# Patient Record
Sex: Female | Born: 1970 | Race: White | Hispanic: No | Marital: Married
Health system: Southern US, Community
[De-identification: ages and names within clinical notes are randomized; demographics above are authoritative.]

## PROBLEM LIST (undated history)

## (undated) DIAGNOSIS — T7840XA Allergy, unspecified, initial encounter: Secondary | ICD-10-CM

## (undated) DIAGNOSIS — D649 Anemia, unspecified: Secondary | ICD-10-CM

## (undated) DIAGNOSIS — K219 Gastro-esophageal reflux disease without esophagitis: Secondary | ICD-10-CM

## (undated) DIAGNOSIS — M1990 Unspecified osteoarthritis, unspecified site: Secondary | ICD-10-CM

## (undated) HISTORY — DX: Unspecified osteoarthritis, unspecified site: M19.90

## (undated) HISTORY — DX: Gastro-esophageal reflux disease without esophagitis: K21.9

## (undated) HISTORY — PX: COSMETIC SURGERY: SHX468

## (undated) HISTORY — DX: Allergy, unspecified, initial encounter: T78.40XA

## (undated) HISTORY — DX: Anemia, unspecified: D64.9

---

## 2009-04-14 DIAGNOSIS — E039 Hypothyroidism, unspecified: Secondary | ICD-10-CM | POA: Insufficient documentation

## 2011-08-01 DIAGNOSIS — Z8742 Personal history of other diseases of the female genital tract: Secondary | ICD-10-CM | POA: Insufficient documentation

## 2012-11-25 DIAGNOSIS — N951 Menopausal and female climacteric states: Secondary | ICD-10-CM | POA: Insufficient documentation

## 2013-09-22 DIAGNOSIS — Z803 Family history of malignant neoplasm of breast: Secondary | ICD-10-CM | POA: Insufficient documentation

## 2017-10-24 DIAGNOSIS — M1612 Unilateral primary osteoarthritis, left hip: Secondary | ICD-10-CM | POA: Insufficient documentation

## 2020-01-20 DIAGNOSIS — K219 Gastro-esophageal reflux disease without esophagitis: Secondary | ICD-10-CM | POA: Insufficient documentation

## 2020-01-20 DIAGNOSIS — E559 Vitamin D deficiency, unspecified: Secondary | ICD-10-CM | POA: Insufficient documentation

## 2020-01-20 DIAGNOSIS — E538 Deficiency of other specified B group vitamins: Secondary | ICD-10-CM | POA: Insufficient documentation

## 2021-01-31 ENCOUNTER — Ambulatory Visit (INDEPENDENT_AMBULATORY_CARE_PROVIDER_SITE_OTHER): Payer: BC Managed Care – PPO

## 2021-01-31 ENCOUNTER — Other Ambulatory Visit: Payer: Self-pay

## 2021-01-31 ENCOUNTER — Ambulatory Visit (INDEPENDENT_AMBULATORY_CARE_PROVIDER_SITE_OTHER): Payer: BC Managed Care – PPO | Admitting: Sports Medicine

## 2021-01-31 DIAGNOSIS — M47816 Spondylosis without myelopathy or radiculopathy, lumbar region: Secondary | ICD-10-CM | POA: Diagnosis not present

## 2021-01-31 DIAGNOSIS — M24159 Other articular cartilage disorders, unspecified hip: Secondary | ICD-10-CM | POA: Diagnosis not present

## 2021-01-31 DIAGNOSIS — M5030 Other cervical disc degeneration, unspecified cervical region: Secondary | ICD-10-CM

## 2021-01-31 DIAGNOSIS — M4807 Spinal stenosis, lumbosacral region: Secondary | ICD-10-CM

## 2021-01-31 IMAGING — DX DG LUMBAR SPINE COMPLETE 4+V
5 series · 5 of 5 positions shown · non-contrast
Comparison: None.

CLINICAL DATA: Chronic low back pain.  No recent injury.

EXAM:
LUMBAR SPINE - COMPLETE 4+ VIEW

[l-spine ap]
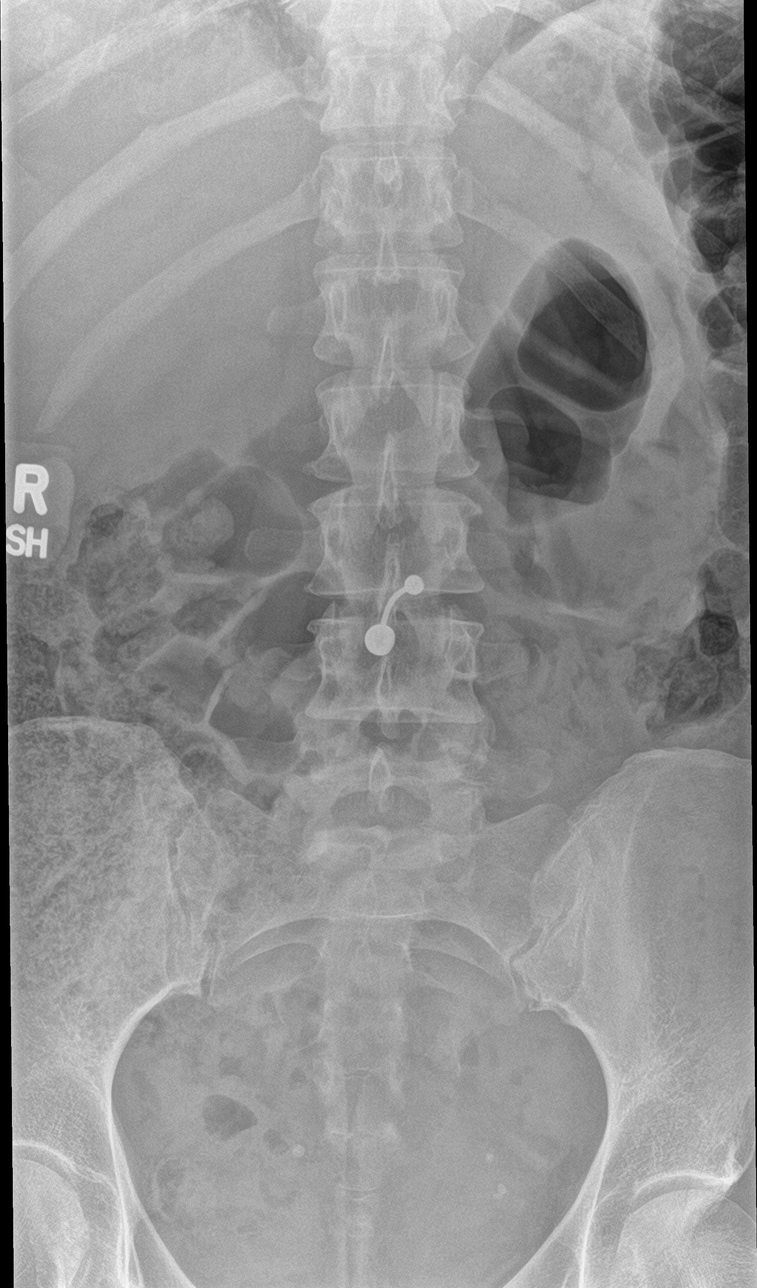

[l-spine obl (1 of 2)]
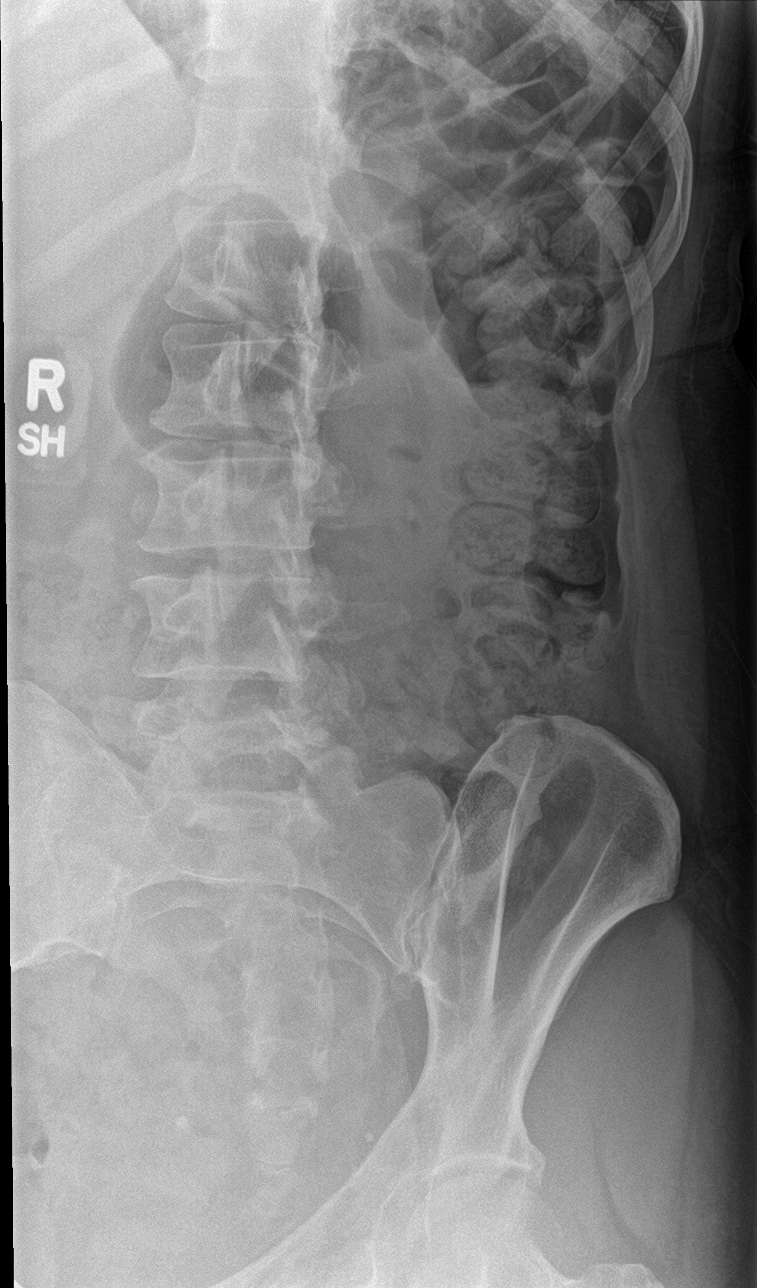

[l-spine obl (2 of 2)]
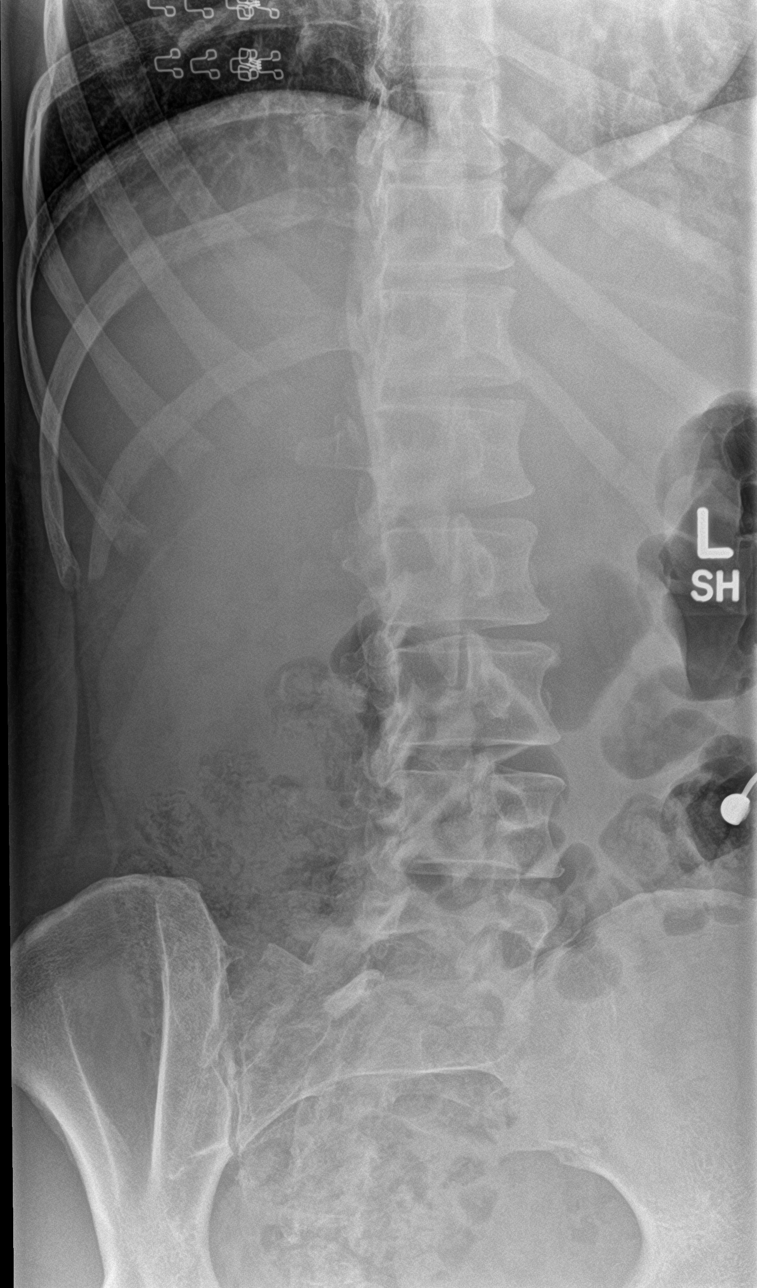

[l-spine lat]
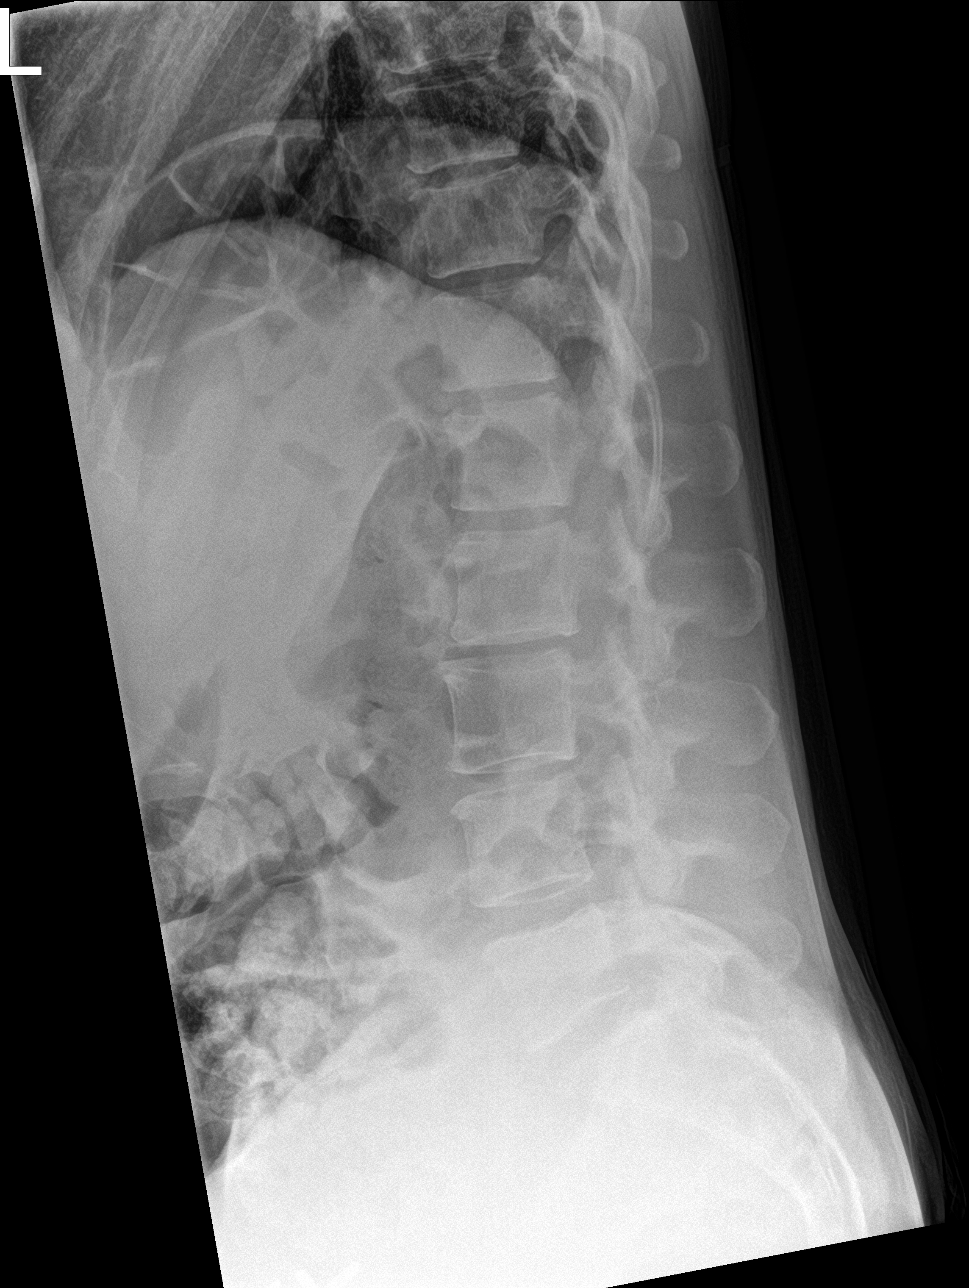

[l-spine spot]
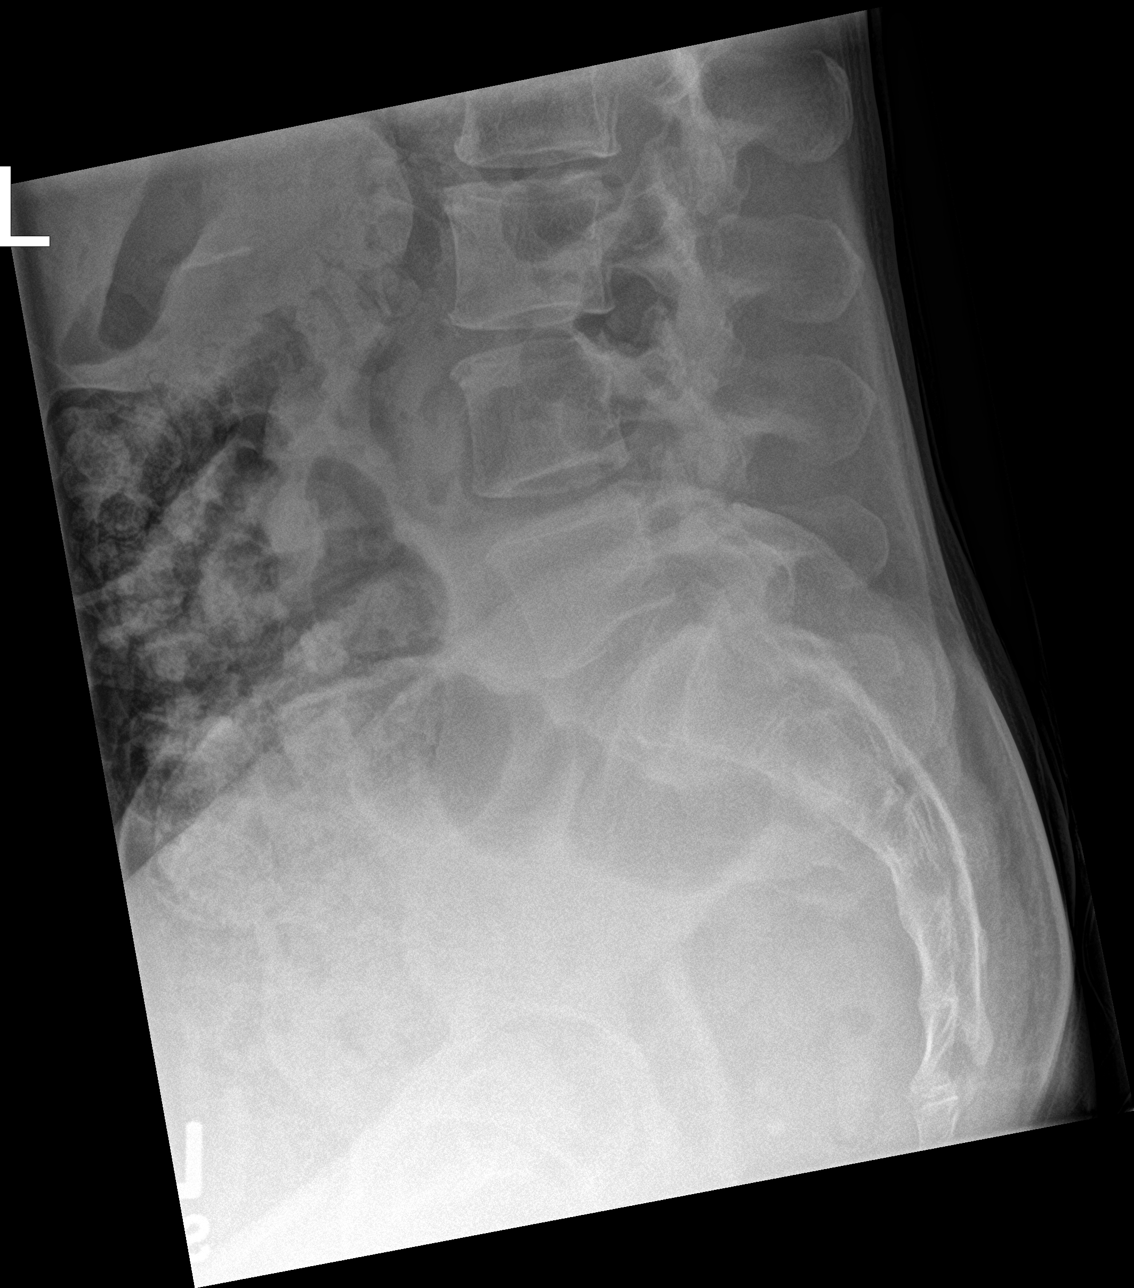

[5 of 5 positions shown; findings below may reference images not displayed]

FINDINGS: Five lumbar type vertebral bodies. Mild straightening of the normal
lumbar lordosis. 2-3 mm of retrolisthesis at L2-3. Mild disc space
narrowing at L2-3. Facet osteoarthritis in the lumbar region, most
pronounced at the L2-3 level. Sacroiliac joints appear normal.
IMPRESSION: Degenerative disc disease at L2-3 with disc space narrowing. Lumbar
region facet osteoarthritis, most pronounced at L2-3, with 2-3 mm
retrolisthesis.

## 2021-01-31 IMAGING — DX DG CERVICAL SPINE COMPLETE 4+V
5 series · 5 of 5 positions shown · non-contrast
Comparison: None.

CLINICAL DATA: Chronic neck pain

EXAM:
CERVICAL SPINE - COMPLETE 4+ VIEW

[c-spine lat]
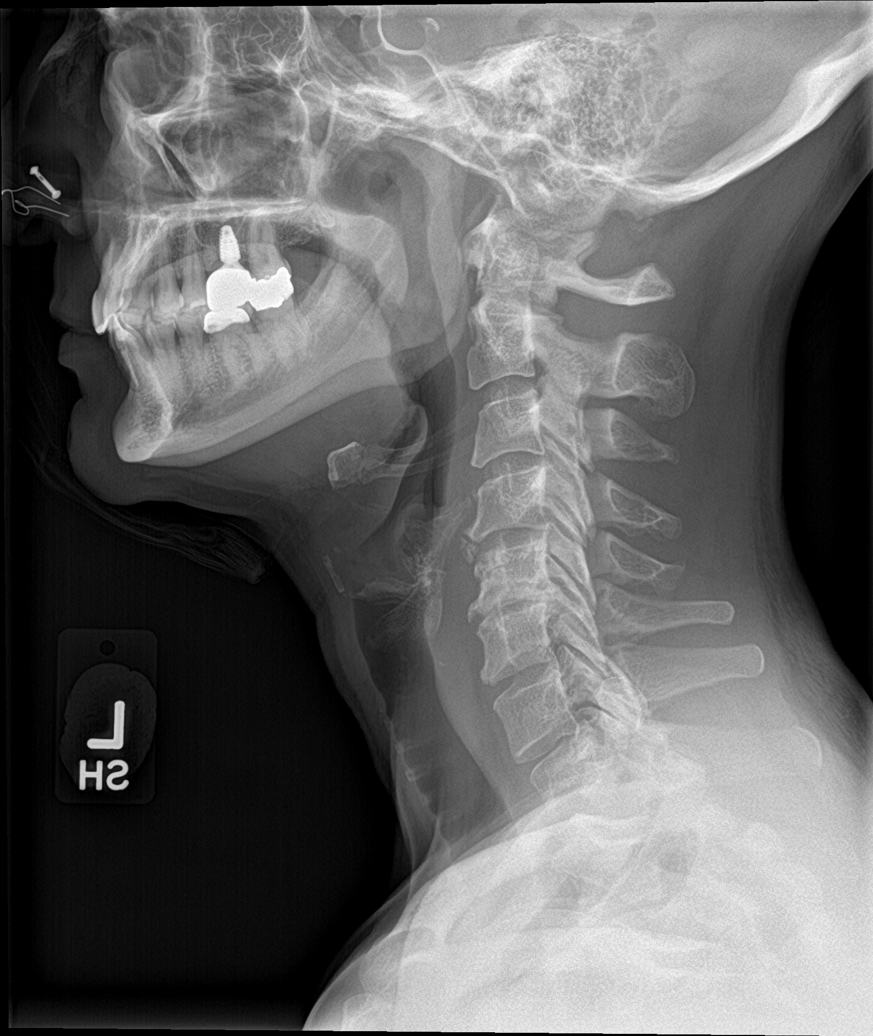

[c-spine obl (1 of 2)]
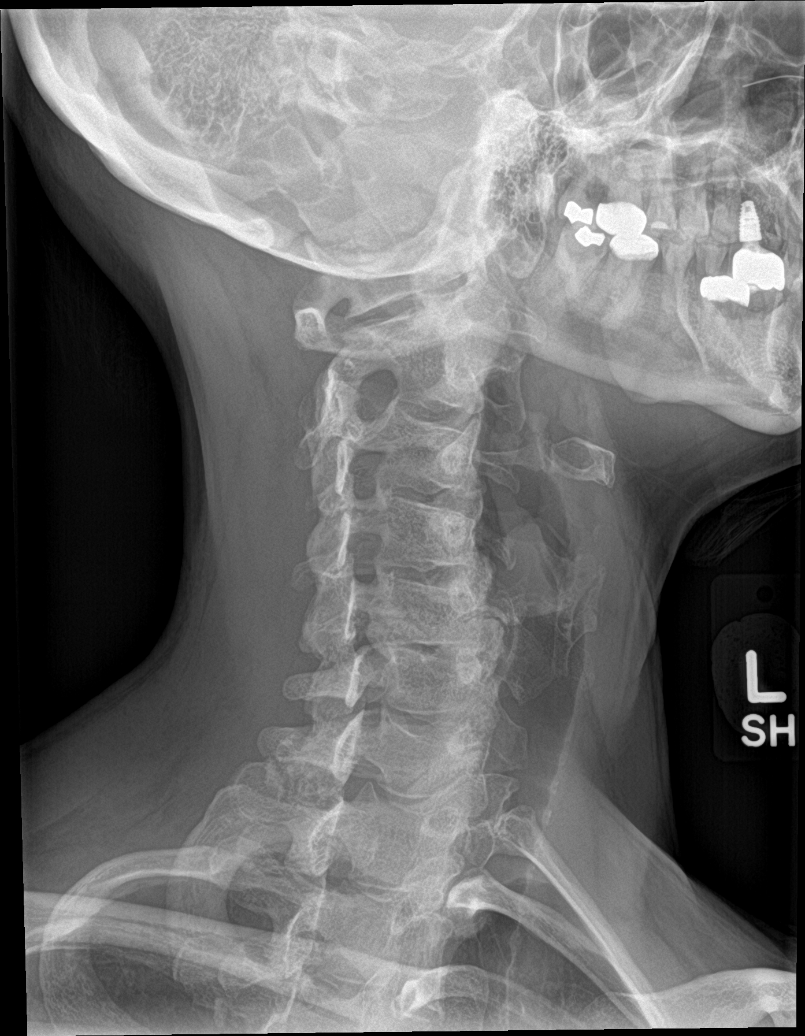

[c-spine obl (2 of 2)]
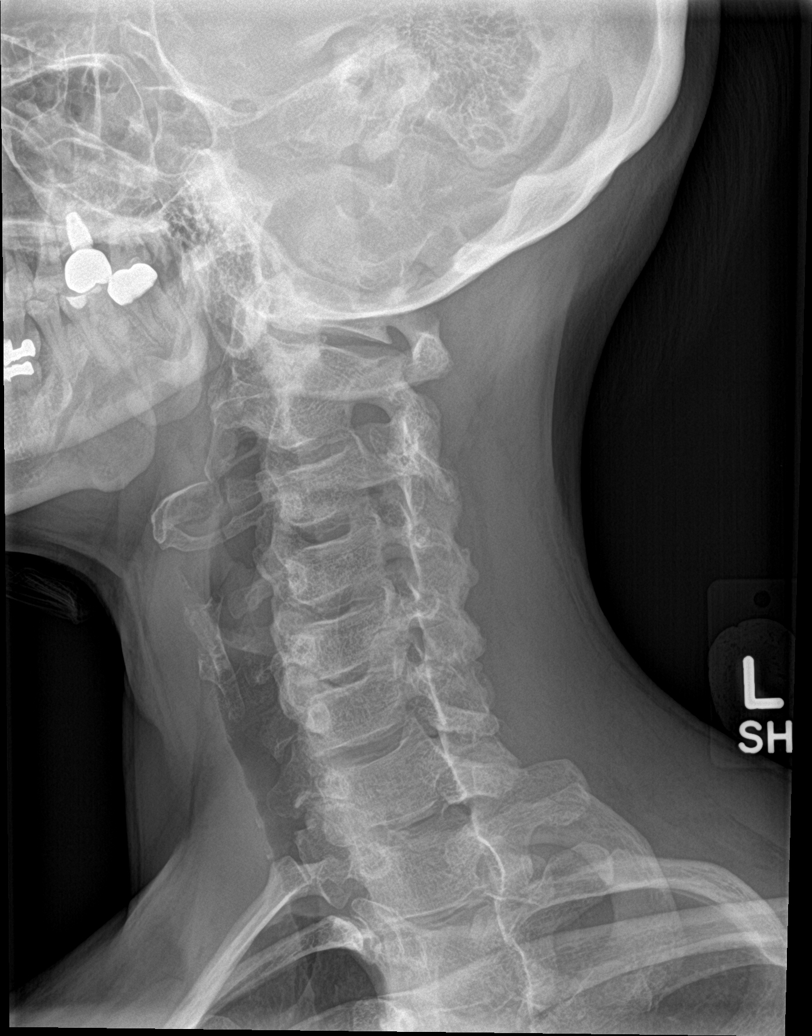

[c-spine ap]
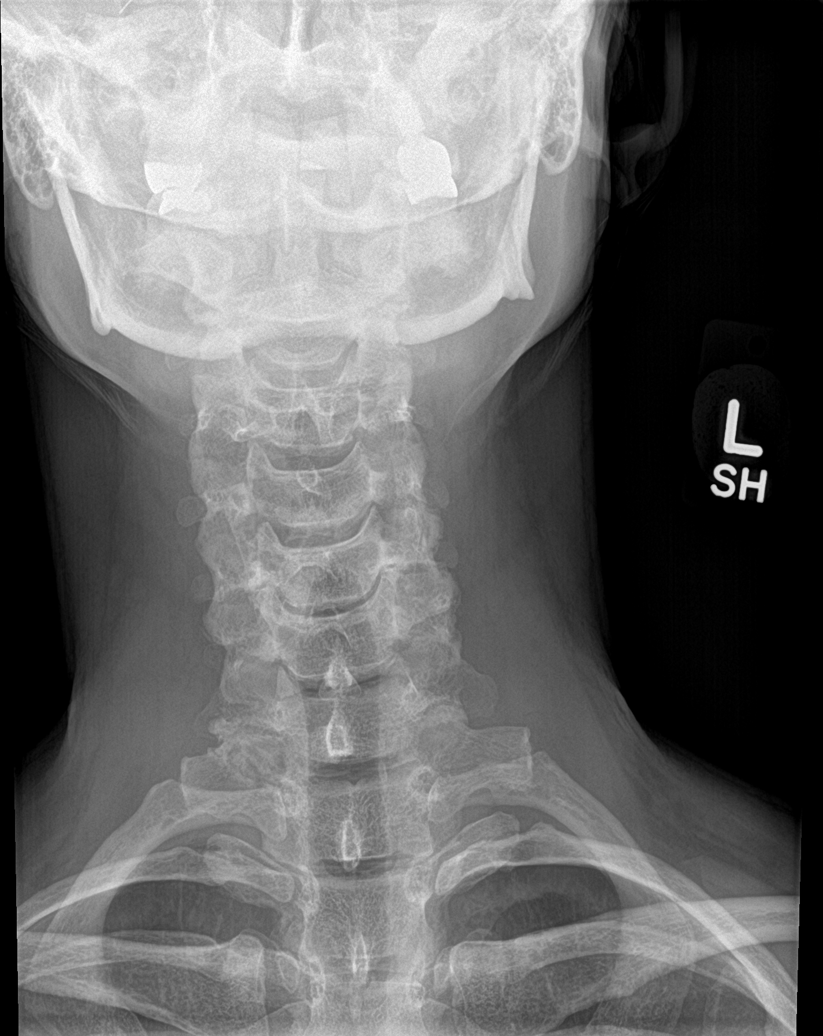

[c-spine open mouth]
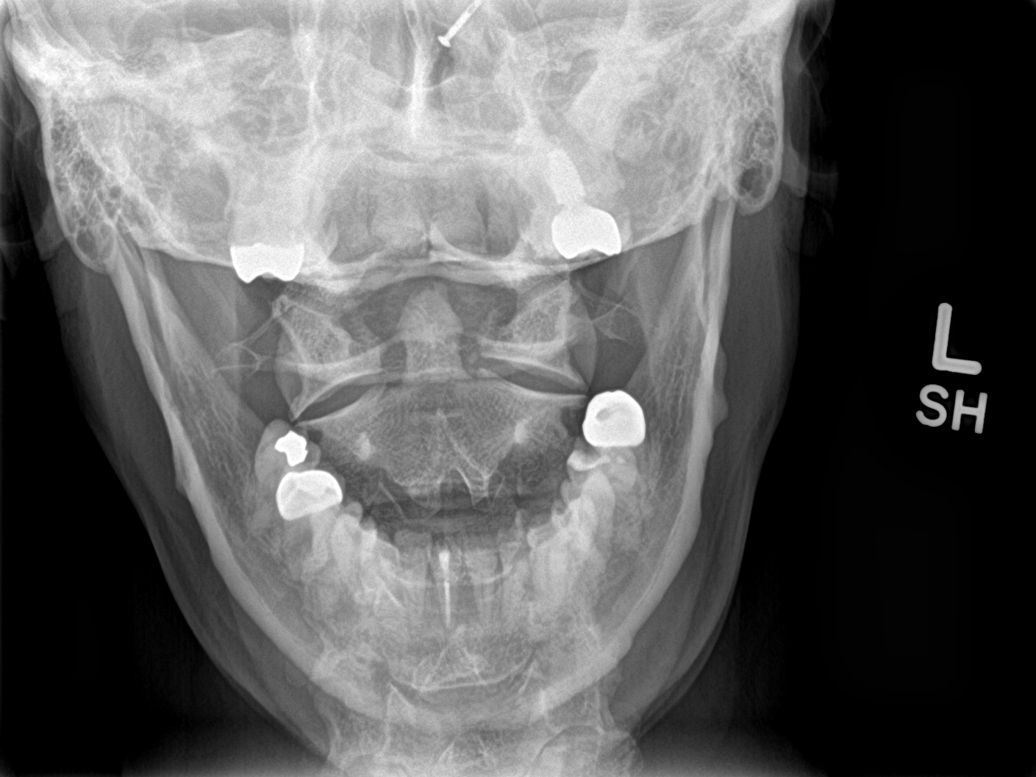

[5 of 5 positions shown; findings below may reference images not displayed]

FINDINGS: Minimal scoliotic curvature convex to the right in the upper
cervical region and to the left in the lower cervical region. Mild
straightening of the normal cervical lordosis. Chronic degenerative
spondylosis most pronounced at C5-6 with disc space narrowing and
marginal osteophytes. Lesser disc space narrowing at C4-5. Facet
osteoarthritis on the right at C7-T1. Bony foraminal narrowing on
the right at C5-6 and to a lesser degree on the left at C5-6. No
focal finding otherwise.
IMPRESSION: Mild scoliotic curvature and straightening of the normal cervical
lordosis.

Degenerative spondylosis, most pronounced at the C5-6 level with
disc space narrowing and marginal osteophytes. Some bony foraminal
narrowing, right more than left.

Facet osteoarthritis on the right at C7-T1.

## 2021-01-31 NOTE — Assessment & Plan Note (Addendum)
This is a pleasant 50 year old female with a long history of neck and low back pain. She has chronic right-sided low back pain worse with standing, nothing overtly radicular. It sounds like she has been through physical therapy, oral medications, ultimately she had some spinal injections, likely epidurals that seem to work about 8 years ago. She is now having recurrence of symptoms, she is not interested in additional physical therapy and short of home rehab, not interested in oral medications but she would like to proceed with interventional treatment. We are going to try to get an MRI approved, x-rays, lumbar spine MRI and we will probably set her up with an epidural based on the results.

## 2021-01-31 NOTE — Assessment & Plan Note (Addendum)
As below she has done really well with epidurals in the past, she does not desire to restart PT with the exception of home rehab, getting some x-rays, MRI, in anticipation of cervical epidural.  Adding cervical epidural. Follow-up me 1 month after the injection we can further discuss her lumbar spine intervention.

## 2021-01-31 NOTE — Assessment & Plan Note (Signed)
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 We can consider injections in the future.

## 2021-01-31 NOTE — Progress Notes (Addendum)
    Procedures performed today:    None.  Independent interpretation of notes and tests performed by another provider:   MRIs reviewed, significant multilevel cervical and lumbar DDD.  Brief History, Exam, Impression, and Recommendations:    Lumbar spondylosis This is a pleasant 50 year old female with a long history of neck and low back pain. She has chronic right-sided low back pain worse with standing, nothing overtly radicular. It sounds like she has been through physical therapy, oral medications, ultimately she had some spinal injections, likely epidurals that seem to work about 8 years ago. She is now having recurrence of symptoms, she is not interested in additional physical therapy and short of home rehab, not interested in oral medications but she would like to proceed with interventional treatment. We are going to try to get an MRI approved, x-rays, lumbar spine MRI and we will probably set her up with an epidural based on the results.  DDD (degenerative disc disease), cervical As below she has done really well with epidurals in the past, she does not desire to restart PT with the exception of home rehab, getting some x-rays, MRI, in anticipation of cervical epidural.  Adding cervical epidural. Follow-up me 1 month after the injection we can further discuss her lumbar spine intervention.  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 We can consider injections in the future.    ___________________________________________ Ihor Austin. Benjamin Stain, M.D., ABFM., CAQSM. Primary Care and Sports Medicine [facility]  Adjunct Instructor of Family Medicine  [employer]

## 2021-01-31 NOTE — Patient Instructions (Signed)
We were unable to find any injection notes through care everywhere, please contact your injection site and have them fax [place] records.  f- [phone]

## 2021-02-03 ENCOUNTER — Ambulatory Visit (INDEPENDENT_AMBULATORY_CARE_PROVIDER_SITE_OTHER): Payer: BC Managed Care – PPO

## 2021-02-03 ENCOUNTER — Other Ambulatory Visit: Payer: Self-pay

## 2021-02-03 DIAGNOSIS — M5030 Other cervical disc degeneration, unspecified cervical region: Secondary | ICD-10-CM

## 2021-02-03 DIAGNOSIS — M47816 Spondylosis without myelopathy or radiculopathy, lumbar region: Secondary | ICD-10-CM

## 2021-02-03 DIAGNOSIS — M4807 Spinal stenosis, lumbosacral region: Secondary | ICD-10-CM

## 2021-02-03 IMAGING — MR MR LUMBAR SPINE W/O CM
4 of 5 series · 26 of 48 positions shown · non-contrast
Comparison: Lumbar spine radiographs [DATE]

CLINICAL DATA: Spinal stenosis. Lumbosacral degenerative disc
disease.

EXAM:
MRI LUMBAR SPINE WITHOUT CONTRAST
TECHNIQUE: Multiplanar, multisequence MR imaging of the lumbar spine was
performed. No intravenous contrast was administered.

[Series 2: T2 · sagittal · 4.0mm · 0.81mm/px · 6 of 15 slices shown (1 of 2)]
[im 1/15]
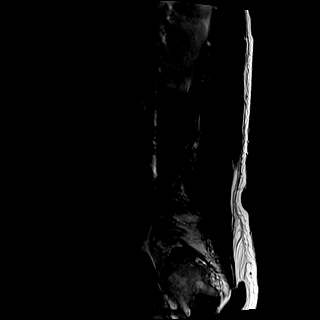
[im 3/15]
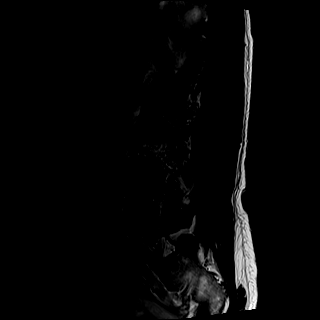
[im 6/15]
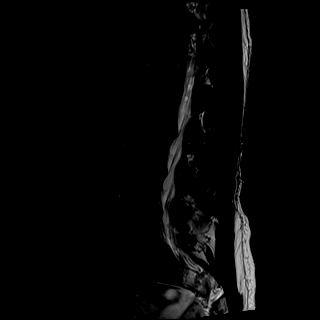
[im 9/15]
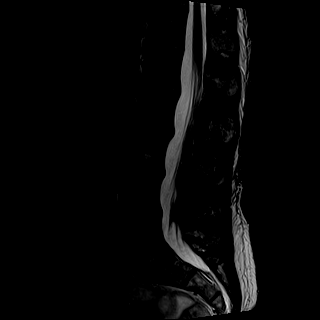
[im 12/15]
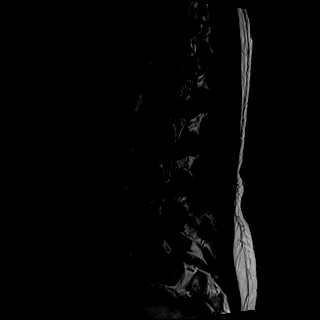
[im 15/15]
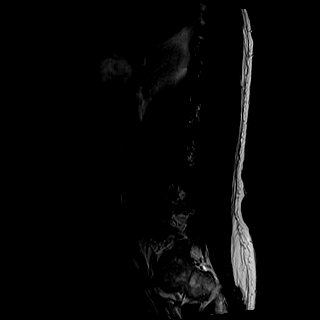

[Series 3: T1 · sagittal · 4.0mm · 0.41mm/px · 6 of 15 slices shown (1 of 2)]
[im 1/15]
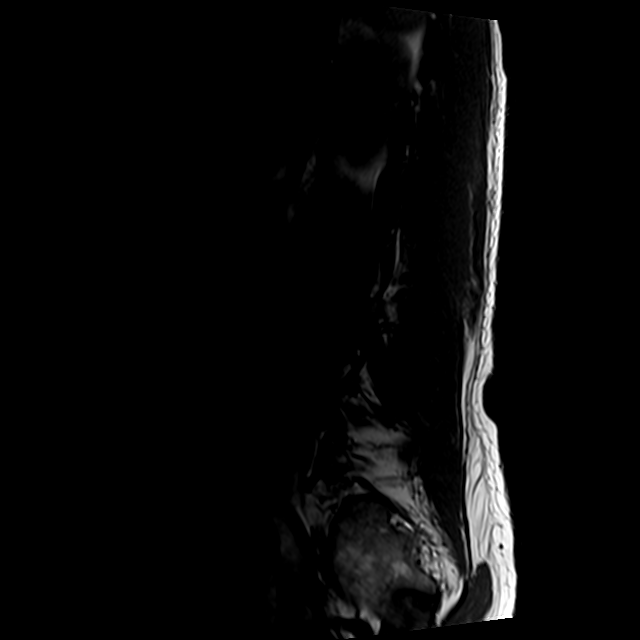
[im 3/15]
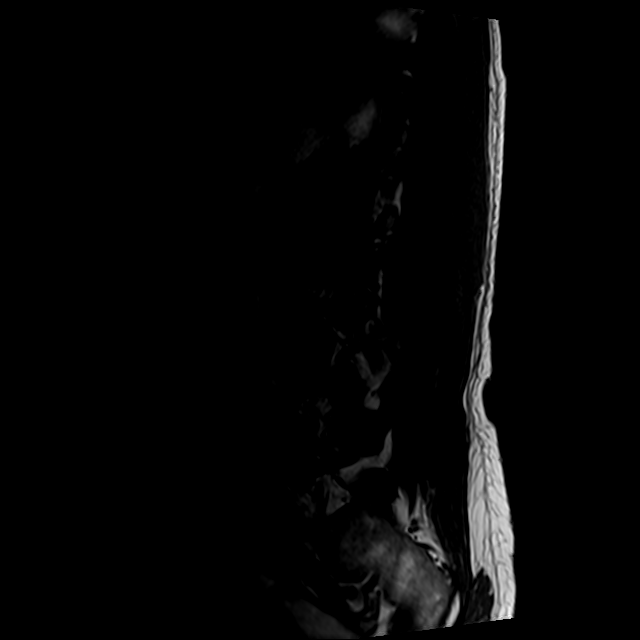
[im 6/15]
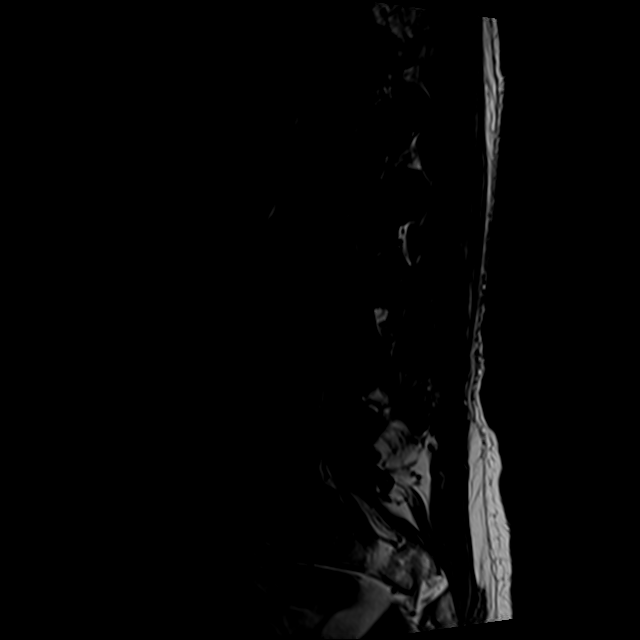
[im 9/15]
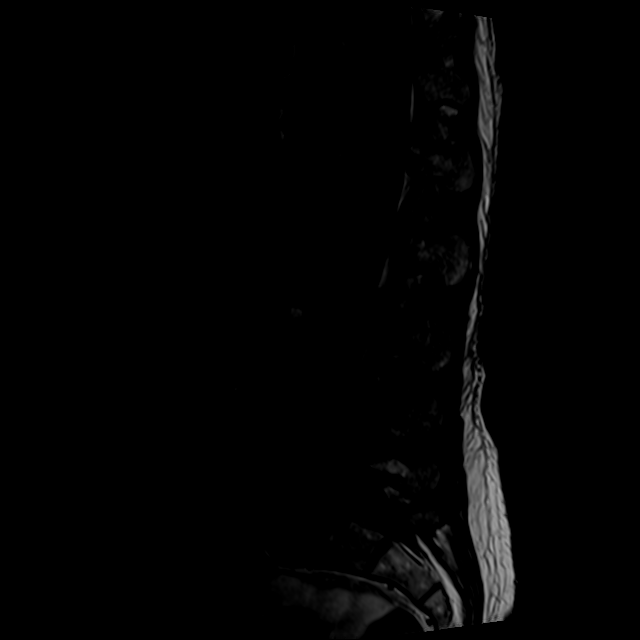
[im 12/15]
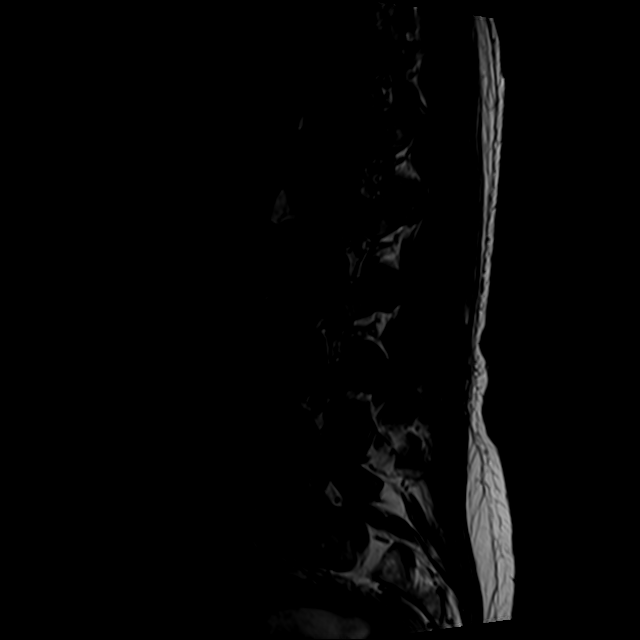
[im 15/15]
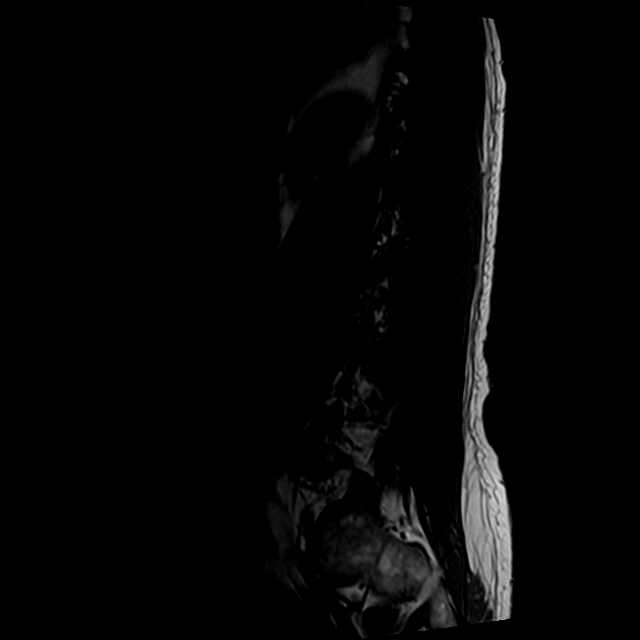

[Series 5: T2 · axial · 4.0mm · 0.78mm/px · z∈[-501,-302]mm · 9 of 36 slices shown (2 of 2)]
[im 1/36]
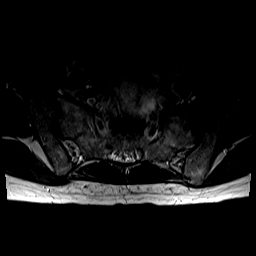
[im 6/36]
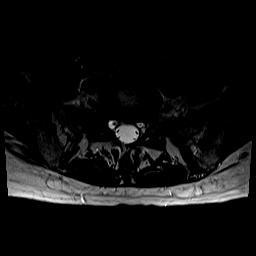
[im 11/36]
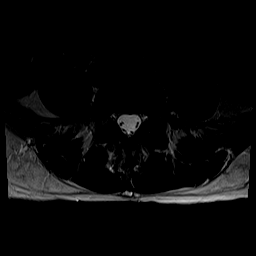
[im 16/36]
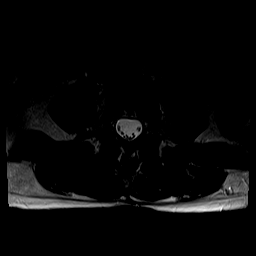
[im 18/36]
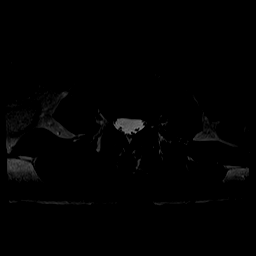
[im 21/36]
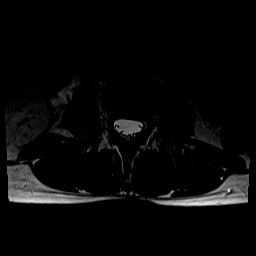
[im 26/36]
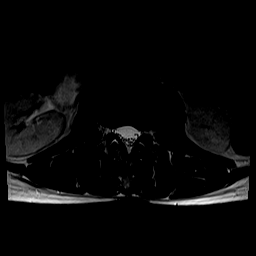
[im 31/36]
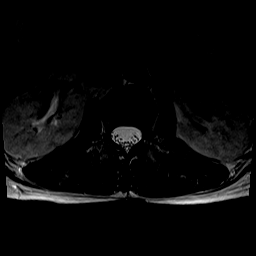
[im 36/36]
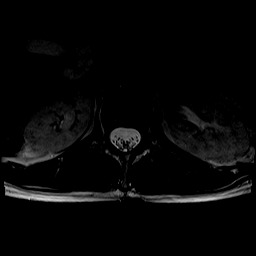

[Series 6: T1 · axial · 4.0mm · 0.39mm/px · z∈[-501,-327]mm · 5 of 36 slices shown (2 of 2)]
[im 1/36]
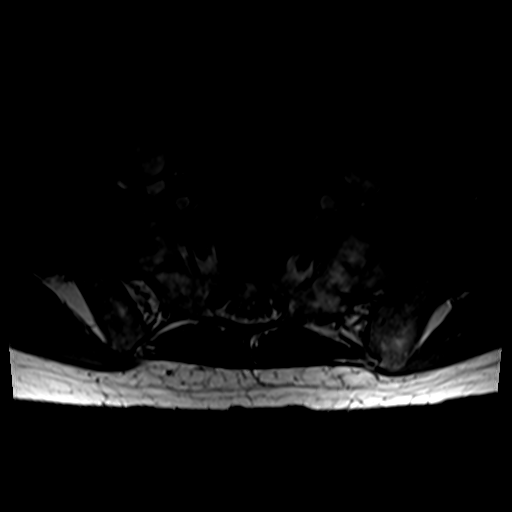
[im 6/36]
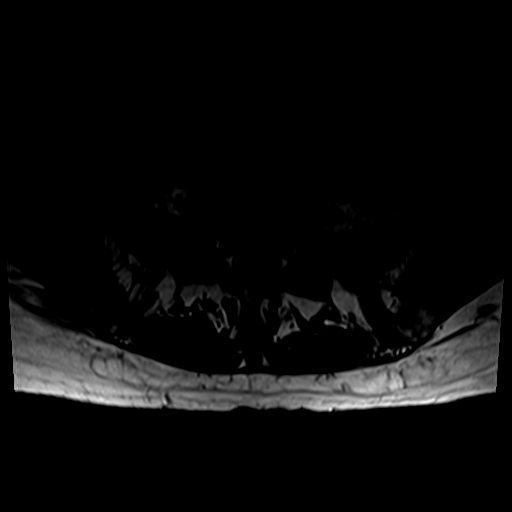
[im 11/36]
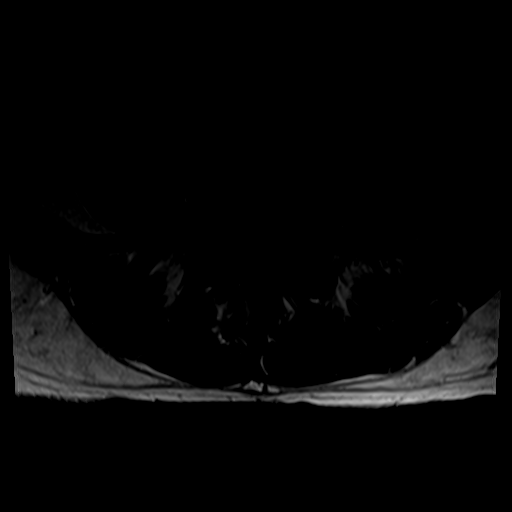
[im 18/36]
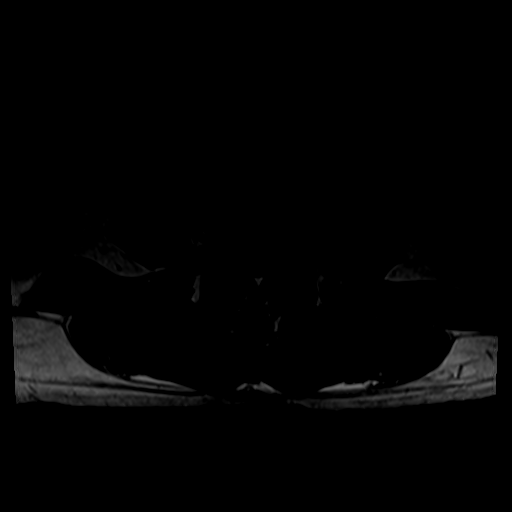
[im 31/36]
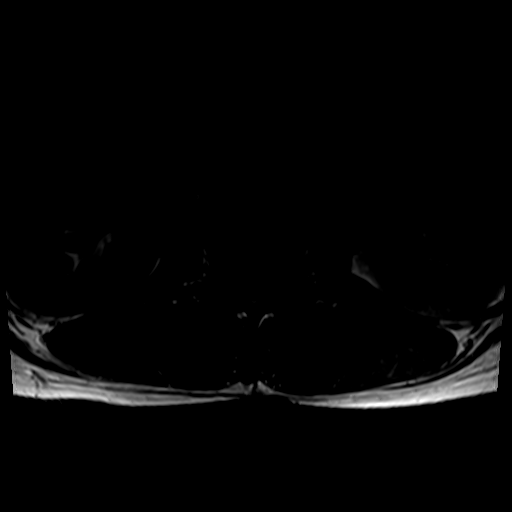

[26 of 48 positions shown; findings below may reference images not displayed]

FINDINGS: Segmentation: 5 non rib-bearing lumbar type vertebral bodies are
present. The lowest fully formed vertebral body is L5.

Alignment: Slight retrolisthesis is again noted at L2-3. No other
significant listhesis is present. There is some straightening of the
normal lumbar lordosis.

Vertebrae:  Marrow signal and vertebral body heights are normal.

Conus medullaris and cauda equina: Conus extends to the L1 level.
Conus and cauda equina appear normal.

Paraspinal and other soft tissues: Limited imaging the abdomen is
unremarkable. There is no significant adenopathy. No solid organ
lesions are present.

Disc levels:

L1-2: Negative.

L2-3: Far left lateral disc protrusion extends into and beyond the
left foramen likely impacting the left L2 nerve root. Central canal
and right foramen are patent.

L3-4: Broad-based disc protrusion is asymmetric to the left. Far
left annular tear pain is noted. Mild left subarticular and
foraminal stenosis is present.

L4-5: Mild disc bulging and bilateral facet hypertrophy is present.
No significant stenosis is present.

L5-S1: Mild facet hypertrophy is present. Disc signal is preserved.
No protrusion is present. No significant stenosis is present.
IMPRESSION: 1. Far left lateral disc protrusion at L2-3 likely impacting the
left L2 nerve root.
2. Broad-based disc protrusion and far left annular tear at L3-4
with mild left subarticular and foraminal stenosis.
3. Mild disc bulging and facet hypertrophy at L4-5 without
significant stenosis.

## 2021-02-06 NOTE — Addendum Note (Signed)
Addended by: Monica Becton on: 02/06/2021 12:47 PM   Modules accepted: Orders

## 2021-02-16 ENCOUNTER — Ambulatory Visit
Admission: RE | Admit: 2021-02-16 | Discharge: 2021-02-16 | Disposition: A | Discharge status: Home / Self Care | Payer: BC Managed Care – PPO | Source: Ambulatory Visit | Attending: Sports Medicine | Admitting: Sports Medicine

## 2021-02-16 DIAGNOSIS — M5030 Other cervical disc degeneration, unspecified cervical region: Secondary | ICD-10-CM

## 2021-02-16 IMAGING — XA DG INJECT/[PERSON_NAME] INC NEEDLE/CATH/PLC EPI/CERV/THOR W/IMG
2 series · 2 of 2 positions shown · non-contrast
Comparison: none

CLINICAL DATA: Cervical spondylosis without myelopathy. Neck and
shoulder pain. Numbness in the left hand.

[Series 1: ortho standard · 1 of 1 slices shown (1 of 2)]
[im 1/1]
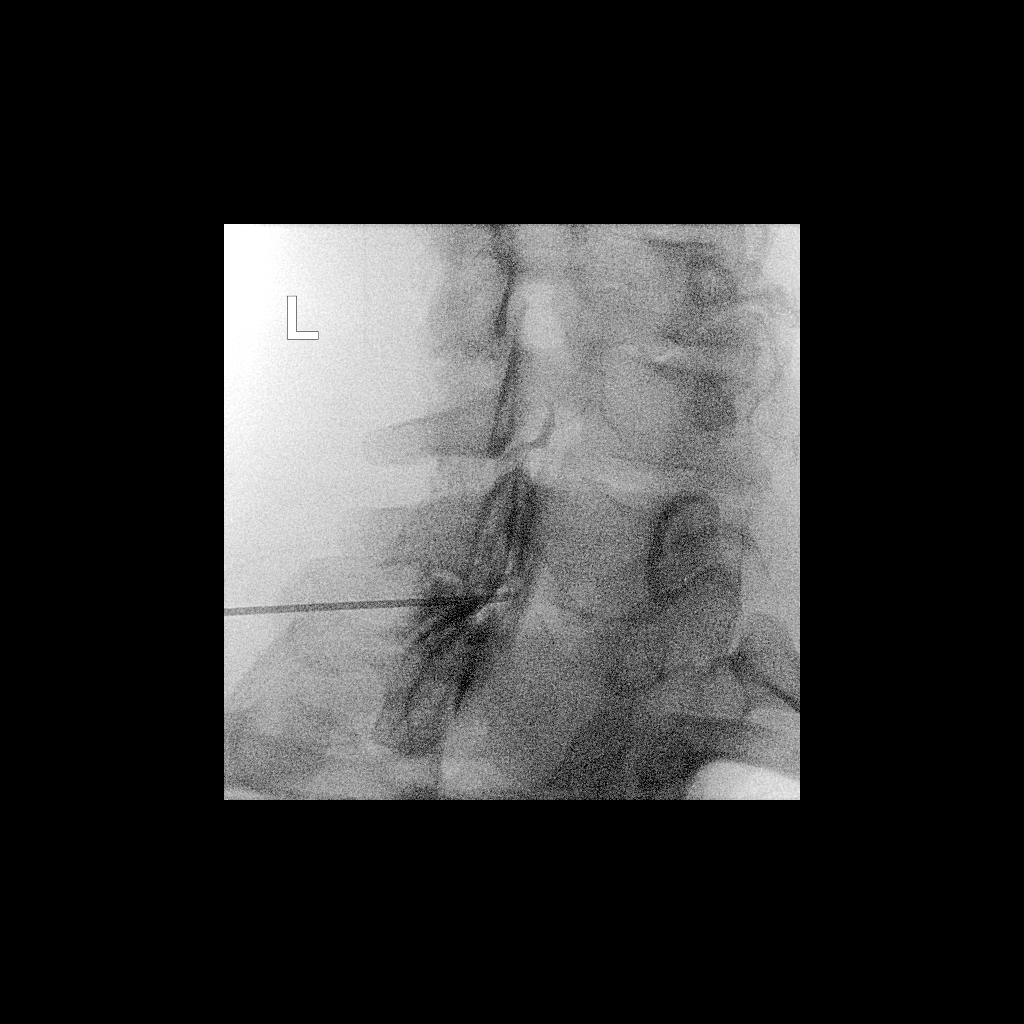

[Series 2: ortho standard · 1 of 1 slices shown (2 of 2)]
[im 1/1]
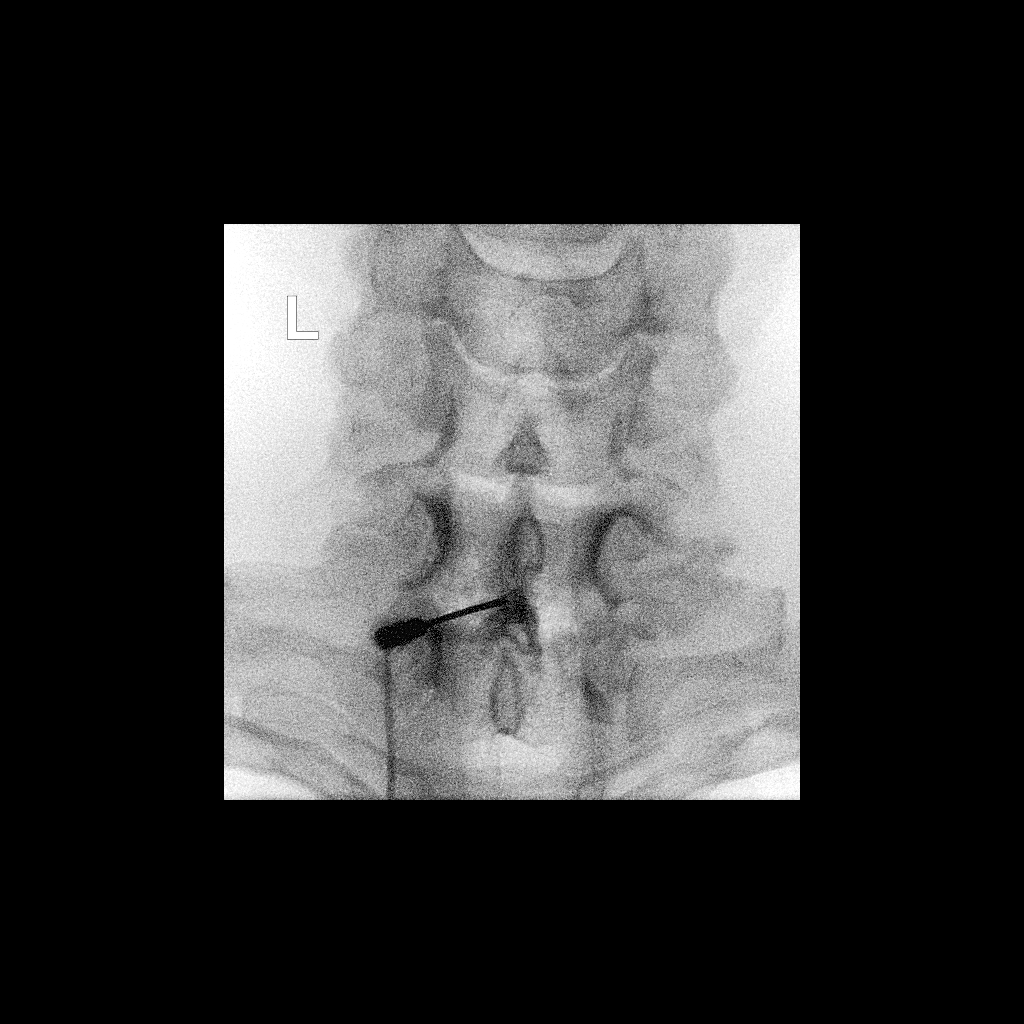

[2 of 2 positions shown; findings below may reference images not displayed]

FLUOROSCOPY TIME:  Fluoroscopy Time: 22 seconds

Radiation Exposure Index: 3.79 microGray*m^2

PROCEDURE:
The procedure, risks, benefits, and alternatives were explained to
the patient. Questions regarding the procedure were encouraged and
answered. The patient understands and consents to the procedure.

CERVICAL EPIDURAL INJECTION

An interlaminar approach was performed on the left at C7-T1. A
inch 20 gauge epidural needle was advanced using loss-of-resistance
technique.

DIAGNOSTIC EPIDURAL INJECTION

Injection of Isovue-M 300 shows a good epidural pattern with spread
above and below the level of needle placement, bilaterally but
greater on the left. No vascular opacification is seen.

THERAPEUTIC EPIDURAL INJECTION

1.5 ml of Kenalog 40 mixed with 2 ml of normal saline were then
instilled. The procedure was well-tolerated, and the patient was
discharged thirty minutes following the injection in good condition.
IMPRESSION: Technically successful interlaminar epidural injection on the left
at C7-T1.

## 2021-02-16 MED ORDER — TRIAMCINOLONE ACETONIDE 40 MG/ML IJ SUSP (RADIOLOGY)
60.0000 mg | Freq: Once | INTRAMUSCULAR | Status: AC
  Administered 2021-02-16: 60 mg via EPIDURAL

## 2021-02-16 MED ORDER — IOPAMIDOL (ISOVUE-M 300) INJECTION 61%
1.0000 mL | Freq: Once | INTRAMUSCULAR | Status: AC
  Administered 2021-02-16: 1 mL via EPIDURAL

## 2021-02-16 NOTE — Discharge Instructions (Signed)
Post Procedure Spinal Discharge Instruction Sheet  1. You may resume a regular diet and any medications that you routinely take (including pain medications).  2. No driving day of procedure.  3. Light activity throughout the rest of the day.  Do not do any strenuous work, exercise, bending or lifting.  The day following the procedure, you can resume normal physical activity but you should refrain from exercising or physical therapy for at least three days thereafter.   Common Side Effects:   Headaches- take your usual medications as directed by your physician.  Increase your fluid intake.  Caffeinated beverages may be helpful.  Lie flat in bed until your headache resolves.   Restlessness or inability to sleep- you may have trouble sleeping for the next few days.  Ask your referring physician if you need any medication for sleep.   Facial flushing or redness- should subside within a few days.   Increased pain- a temporary increase in pain a day or two following your procedure is not unusual.  Take your pain medication as prescribed by your referring physician.   Leg cramps  Please contact our office at [phone] for the following symptoms:  Fever greater than 100 degrees.  Headaches unresolved with medication after 2-3 days.  Increased swelling, pain, or redness at injection site.   Thank you for visiting [facility] today.  

## 2021-02-19 DIAGNOSIS — K900 Celiac disease: Secondary | ICD-10-CM | POA: Insufficient documentation

## 2021-03-12 ENCOUNTER — Other Ambulatory Visit: Payer: Self-pay

## 2021-03-12 ENCOUNTER — Ambulatory Visit: Payer: BC Managed Care – PPO | Admitting: Sports Medicine

## 2021-03-12 DIAGNOSIS — M5030 Other cervical disc degeneration, unspecified cervical region: Secondary | ICD-10-CM | POA: Diagnosis not present

## 2021-03-12 DIAGNOSIS — M47816 Spondylosis without myelopathy or radiculopathy, lumbar region: Secondary | ICD-10-CM

## 2021-03-12 MED ORDER — CELECOXIB 200 MG PO CAPS: ORAL_CAPSULE | ORAL | 2 refills | Status: DC

## 2021-03-12 NOTE — Progress Notes (Signed)
    Procedures performed today:    None.  Independent interpretation of notes and tests performed by another provider:   None.  Brief History, Exam, Impression, and Recommendations:    DDD (degenerative disc disease), cervical Cervical epidural has worked really well, only minimal discomfort. She can live with it for now.  Lumbar spondylosis Mild multilevel lumbar spondylosis. Pain predominantly axial, discogenic, before we consider an epidural we will increase his Celebrex to 200 mg twice daily and revisit physical therapy. Return to see me in 4 weeks, she can call me anywhere along the way for the epidural if she would desire.    ___________________________________________ Ihor Austin. Benjamin Stain, M.D., ABFM., CAQSM. Primary Care and Sports Medicine [facility]  Adjunct Instructor of Family Medicine  [employer]

## 2021-03-12 NOTE — Assessment & Plan Note (Signed)
Mild multilevel lumbar spondylosis. Pain predominantly axial, discogenic, before we consider an epidural we will increase his Celebrex to 200 mg twice daily and revisit physical therapy. Return to see me in 4 weeks, she can call me anywhere along the way for the epidural if she would desire.

## 2021-03-12 NOTE — Assessment & Plan Note (Signed)
Cervical epidural has worked really well, only minimal discomfort. She can live with it for now.

## 2021-03-21 ENCOUNTER — Other Ambulatory Visit: Payer: Self-pay

## 2021-03-21 ENCOUNTER — Ambulatory Visit (INDEPENDENT_AMBULATORY_CARE_PROVIDER_SITE_OTHER): Payer: BC Managed Care – PPO | Admitting: Rehabilitative and Restorative Service Providers"

## 2021-03-21 ENCOUNTER — Encounter: Payer: Self-pay | Admitting: Rehabilitative and Restorative Service Providers"

## 2021-03-21 DIAGNOSIS — R29898 Other symptoms and signs involving the musculoskeletal system: Secondary | ICD-10-CM

## 2021-03-21 DIAGNOSIS — G8929 Other chronic pain: Secondary | ICD-10-CM | POA: Diagnosis not present

## 2021-03-21 DIAGNOSIS — M545 Low back pain, unspecified: Secondary | ICD-10-CM | POA: Diagnosis not present

## 2021-03-21 DIAGNOSIS — M6281 Muscle weakness (generalized): Secondary | ICD-10-CM | POA: Diagnosis not present

## 2021-03-21 NOTE — Therapy (Signed)
[facility] [place] [address] [place], [place], [address] Phone: [phone]   Fax:  [phone]  Physical Therapy Evaluation  Patient Details  Name: Kathryn Mosley MRN: [id number] Date of Birth: 10-11-1970 Referring Provider (PT): Dr Benjamin Stain   Encounter Date: 03/21/2021   PT End of Session - 03/21/21 1133     Visit Number 1    Number of Visits 12    Date for PT Re-Evaluation 05/02/21    PT Start Time 1016    PT Stop Time 1102    PT Time Calculation (min) 46 min    Activity Tolerance Patient tolerated treatment well             History reviewed. No pertinent past medical history.  History reviewed. No pertinent surgical history.  There were no vitals filed for this visit.    Subjective Assessment - 03/21/21 1020     Subjective Patient reports chronic LBP for the past 8 years. She has done hot yoga for several years and MD thought she may have back problems related to the exercise. She has had ESIs for lumbar spine and deep tissue work in the past. She has pain in the LB and a feeling that the LB is "stuck" for the past 8 years.    Pertinent History chronic LBP; cervical pain; spinal narrowing; ACL repair Rt x 2; torn labrum bilat hips s/p debridement ~ 5 yrs ago Rt; 4 yrs Lt; c-section 1994    Patient Stated Goals stand and sit without pain for 1 hour    Currently in Pain? Yes    Pain Score 7     Pain Location Back    Pain Orientation Right;Left;Lower    Pain Descriptors / Indicators Aching;Other (Comment)   stuck   Pain Type Chronic pain    Pain Radiating Towards to toes at times with sitting    Pain Onset More than a month ago    Pain Frequency Constant    Aggravating Factors  sitting; prolonged standing; back bending; lifting; turning;    Pain Relieving Factors forward bending                [facility] PT Assessment - 03/21/21 0001       Assessment   Medical Diagnosis Lumbar spondylosis    Referring  Provider (PT) Dr Benjamin Stain    Onset Date/Surgical Date 09/16/12    Hand Dominance Right    Next MD Visit after 4 PT visits    Prior Therapy yes for LBP hip pain neck pain      Precautions   Precautions None      Restrictions   Weight Bearing Restrictions No      Balance Screen   Has the patient fallen in the past 6 months No    Has the patient had a decrease in activity level because of a fear of falling?  No    Is the patient reluctant to leave their home because of a fear of falling?  No      Home Tourist information centre manager residence    Living Arrangements Spouse/significant other      Prior Function   Level of Independence Independent    Vocation Self employed   free Conservation officer, historic buildings   Vocation Requirements sitting; computer; household chores    Leisure hiking 3 days/wk for 5 hours; walking daily ~ 1 hour      Observation/Other Assessments   Focus on Therapeutic Outcomes (FOTO)  37      Sensation   Additional Comments intermittent numbness/tingling into toes      Posture/Postural Control   Posture Comments forward rounded posture and alignment in sitting; shoulders rounded in standing      AROM   Lumbar Flexion 100% palms to floor    Lumbar Extension 65%    Lumbar - Right Side Bend 85%    Lumbar - Left Side Bend 75% discomfort in Lt LB    Lumbar - Right Rotation 55%    Lumbar - Left Rotation 45% pain in the Lt LB      Strength   Right Hip Extension 5/5    Right Hip ABduction 5/5    Left Hip Extension 5/5    Left Hip ABduction 5/5      Flexibility   Hamstrings WFL's    Quadriceps WFL's    ITB WFL's    Piriformis WFL's      Palpation   Spinal mobility hypomobile lumbar spine with PA mobs greatest at ~ L3/4    SI assessment  pain with palpation    Palpation comment tightness Lt > Rt gluts/piriformis                        Objective measurements completed on examination: See above findings.       [facility] Adult PT  Treatment/Exercise - 03/21/21 0001       Self-Care   Self-Care Other Self-Care Comments    Other Self-Care Comments  initiated spine care education - improtance of core stabilization      Lumbar Exercises: Standing   Wall Slides 10 reps   10 sec hold 1/4 squat core engaged     Lumbar Exercises: Seated   Sit to Stand 10 reps   slow stand to sit with core engaged     Lumbar Exercises: Supine   AB Set Limitations 3 part core 10 sec hold x 10-15 reps      Manual Therapy   Joint Mobilization PA mobs lumbar spine followed by prone press up with good improvement in mobility and report of decrease in pain or sensation of being "stuck"                    PT Education - 03/21/21 1057     Education Details HEP; POC; DN; posture; TENS    Person(s) Educated Patient    Methods Explanation;Demonstration;Tactile cues;Verbal cues;Handout    Comprehension Verbalized understanding;Returned demonstration;Verbal cues required;Tactile cues required              PT Short Term Goals - 03/21/21 1143       PT SHORT TERM GOAL #1   Title Patient will tolerate sitting or standing for ~ 1 hour with no increase in LBP    Time 6    Period Weeks    Status New    Target Date 05/02/21      PT SHORT TERM GOAL #2   Title Imprve core strength and stability with patient to demonstrate tolerance of core strengthening program for 30-40 minutes    Time 6    Period Weeks    Status New    Target Date 05/02/21      PT SHORT TERM GOAL #3   Title Patient to demonstrate and/or verbalize proper body mechanics for sitting; standing; positional changes    Time 6    Period Weeks    Status New    Target Date  05/02/21      PT SHORT TERM GOAL #4   Title Independent in HEP including community aquatic program as indicated    Time 6    Period Weeks    Status New    Target Date 05/02/21      PT SHORT TERM GOAL #5   Title Improve functional limitation score to 55    Time 6    Period Weeks     Status New    Target Date 05/02/21                       Plan - 03/21/21 1134     Clinical Impression Statement Patient presents with ~ 8 year history of LBP with no known injury. She has a history of cervical dysfunction and has received ESIs for LBP and cervical pain. Patient has a history of bilat labral tears s/p debridement of bilat hips; ACL Rt knee x 2 surgeries; C-section 1994. 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 Patient has good lumbar ROM with some tightness and pain with Lt rotation and lateral flexion. She has muscular tightness through posterior hips and hip flexors Lt > Rt and poor core strength and stability. Patient has pain on a constant basis with symptoms increased with prolonged standing or sitting. Patient will benefit from PT to address problems identified. PT discussed realistic goals for therapy with chronic nature of problems.    Stability/Clinical Decision Making Stable/Uncomplicated    Clinical Decision Making Low    Rehab Potential Good    PT Frequency 2x / week    PT Duration 6 weeks    PT Treatment/Interventions ADLs/Self Care Home Management;Aquatic Therapy;Cryotherapy;Electrical Stimulation;Iontophoresis 4mg /ml Dexamethasone;Moist Heat;Ultrasound;Functional mobility training;Therapeutic activities;Therapeutic exercise;Balance training;Neuromuscular re-education;Patient/family education;Manual techniques;Passive range of motion;Dry needling;Taping    PT Next Visit Plan review HEP - progress with core stabilization (may include plank, TB antirotation and UE exercises, weights with functional strengthening); trial of DN/manual therapy; modalities as indicated _ has used TENS in the past but does not have TENS unit    PT Home Exercise Plan [email or web address]    Consulted and Agree with Plan of Care Patient             Patient will benefit from skilled therapeutic intervention in order to improve the following  deficits and impairments:  Decreased activity tolerance, Pain, Hypomobility, Improper body mechanics, Decreased strength, Postural dysfunction  Visit Diagnosis: Chronic bilateral low back pain, unspecified whether sciatica present - Plan: PT plan of care cert/re-cert, CANCELED: PT plan of care cert/re-cert  Muscle weakness (generalized) - Plan: PT plan of care cert/re-cert, CANCELED: PT plan of care cert/re-cert  Other symptoms and signs involving the musculoskeletal system - Plan: PT plan of care cert/re-cert, CANCELED: PT plan of care cert/re-cert     Problem List Patient Active Problem List   Diagnosis Date Noted   DDD (degenerative disc disease), cervical 01/31/2021   Lumbar spondylosis 01/31/2021   Labral tear of hip, degenerative 01/31/2021    Antoinetta Berrones 02/02/2021 PT, MPH  03/21/2021, 12:01 PM  [facility] [address] [place], [place], [place] Phone: [phone]   Fax:  [phone]  Name: Kathryn Mosley MRN: Betsey Amen Date of Birth: 03-Jun-1971

## 2021-03-21 NOTE — Patient Instructions (Signed)
Access Code: [email or web address]: [email or web address]: 07/06/2022Prepared by: Keri Tavella HoltExercises  Supine Transversus Abdominis Bracing with Pelvic Floor Contraction - 2 x daily - 7 x weekly - 1 sets - 10 reps - 10sec hold  Sit to Stand - 2 x daily - 7 x weekly - 1 sets - 10 reps - 3-5 sec hold  Wall Quarter Squat - 2 x daily - 7 x weekly - 1-2 sets - 10 reps - 5-10 sec hold Patient Education  Posture and Body Mechanics  TENS Unit  Trigger Point Dry Needling

## 2021-03-26 ENCOUNTER — Encounter: Payer: Self-pay | Admitting: Rehabilitative and Restorative Service Providers"

## 2021-03-26 ENCOUNTER — Ambulatory Visit: Payer: BC Managed Care – PPO | Admitting: Rehabilitative and Restorative Service Providers"

## 2021-03-26 ENCOUNTER — Other Ambulatory Visit: Payer: Self-pay

## 2021-03-26 DIAGNOSIS — M545 Low back pain, unspecified: Secondary | ICD-10-CM | POA: Diagnosis not present

## 2021-03-26 DIAGNOSIS — R29898 Other symptoms and signs involving the musculoskeletal system: Secondary | ICD-10-CM | POA: Diagnosis not present

## 2021-03-26 DIAGNOSIS — M6281 Muscle weakness (generalized): Secondary | ICD-10-CM | POA: Diagnosis not present

## 2021-03-26 DIAGNOSIS — G8929 Other chronic pain: Secondary | ICD-10-CM | POA: Diagnosis not present

## 2021-03-26 NOTE — Therapy (Signed)
[facility] [place] [address] [place], [place], [address] Phone: [phone]   Fax:  [phone]  Physical Therapy Treatment  Patient Details  Name: Kathryn Mosley MRN: [id number] Date of Birth: 11/08/1970 Referring Provider (PT): Dr Benjamin Stain   Encounter Date: 03/26/2021   PT End of Session - 03/26/21 0844     Visit Number 2    Number of Visits 12    Date for PT Re-Evaluation 05/02/21    PT Start Time 0844    PT Stop Time 0932    PT Time Calculation (min) 48 min    Activity Tolerance Patient tolerated treatment well             History reviewed. No pertinent past medical history.  History reviewed. No pertinent surgical history.  There were no vitals filed for this visit.   Subjective Assessment - 03/26/21 0844     Subjective Patient reports that her back pain is the same. She typically stays at about 7/10 unless she spends a lot of time in the car and then it goes up to a 8/10.    Currently in Pain? Yes    Pain Score 7     Pain Location Back    Pain Orientation Right;Left;Lower    Pain Descriptors / Indicators Aching    Pain Type Chronic pain                               [facility] Adult PT Treatment/Exercise - 03/26/21 0001       Lumbar Exercises: Stretches   Press Ups 5 reps   3-5 sec hold   Press Ups Limitations counter pressure by PT segmentally; then trial with strap around table holding hips flat as pt pressed up into extension      Lumbar Exercises: Standing   Wall Slides 10 reps   10 sec hold 1/4 squat core engaged - 10# KB at chest   Scapular Retraction Strengthening;Both;10 reps;Theraband    Theraband Level (Scapular Retraction) Level 2 (Red)    Row Strengthening;Both;10 reps;Theraband    Theraband Level (Row) Level 4 (Blue)    Shoulder Extension Strengthening;Both;10 reps;Theraband    Theraband Level (Shoulder Extension) Level 4 (Blue)      Lumbar Exercises: Seated   Sit  to Stand 10 reps   slow stand to sit with core engaged 10# KB at chest     Lumbar Exercises: Supine   AB Set Limitations 3 part core 10 sec hold x 10-15 reps      Lumbar Exercises: Quadruped   [place] Horse 5 reps    Other Quadruped Lumbar Exercises sit back to child's pose x 3 reps 20 sec hold      Moist Heat Therapy   Number Minutes Moist Heat 10 Minutes    Moist Heat Location Lumbar Spine      Electrical Stimulation   Electrical Stimulation Location bilat lumbar    Electrical Stimulation Action TENS    Electrical Stimulation Parameters to tolerance    Electrical Stimulation Goals Pain;Tone      Manual Therapy   Manual therapy comments skilled palpation to assess response to DN and manual work through lumbar spine and bilat hips    Joint Mobilization PA mobs lumbar spine followed by prone press up with good improvement in mobility and report of decrease in pain or sensation of being "stuck"  Trigger Point Dry Needling - 03/26/21 0001     Consent Given? Yes    Education Handout Provided Previously provided    Other Dry Needling bilat    Gluteus Medius Response Palpable increased muscle length    Gluteus Maximus Response Palpable increased muscle length    Lumbar multifidi Response Palpable increased muscle length                  PT Education - 03/26/21 0925     Education Details HEP    Person(s) Educated Patient    Methods Explanation;Demonstration;Tactile cues;Verbal cues;Handout    Comprehension Verbalized understanding;Returned demonstration;Verbal cues required;Tactile cues required              PT Short Term Goals - 03/21/21 1143       PT SHORT TERM GOAL #1   Title Patient will tolerate sitting or standing for ~ 1 hour with no increase in LBP    Time 6    Period Weeks    Status New    Target Date 05/02/21      PT SHORT TERM GOAL #2   Title Imprve core strength and stability with patient to demonstrate tolerance of core  strengthening program for 30-40 minutes    Time 6    Period Weeks    Status New    Target Date 05/02/21      PT SHORT TERM GOAL #3   Title Patient to demonstrate and/or verbalize proper body mechanics for sitting; standing; positional changes    Time 6    Period Weeks    Status New    Target Date 05/02/21      PT SHORT TERM GOAL #4   Title Independent in HEP including community aquatic program as indicated    Time 6    Period Weeks    Status New    Target Date 05/02/21      PT SHORT TERM GOAL #5   Title Improve functional limitation score to 55    Time 6    Period Weeks    Status New    Target Date 05/02/21                      Plan - 03/26/21 0848     Clinical Impression Statement Chronic pain at 7/10 on a coutinuous basis with increase to 8/10 with sitting in a car. Has had DN in the past with temporary improvement. Will add trial of DN and core stabilization exercises.    Rehab Potential Good    PT Frequency 2x / week    PT Duration 6 weeks    PT Treatment/Interventions ADLs/Self Care Home Management;Aquatic Therapy;Cryotherapy;Electrical Stimulation;Iontophoresis 4mg /ml Dexamethasone;Moist Heat;Ultrasound;Functional mobility training;Therapeutic activities;Therapeutic exercise;Balance training;Neuromuscular re-education;Patient/family education;Manual techniques;Passive range of motion;Dry needling;Taping    PT Next Visit Plan review HEP - progress with core stabilization (may include plank, TB antirotation and UE exercises, weights with functional strengthening); trial of DN/manual therapy; modalities as indicated _ has used TENS in the past but does not have TENS unit    PT Home Exercise Plan [email or web address]    Consulted and Agree with Plan of Care Patient             Patient will benefit from skilled therapeutic intervention in order to improve the following deficits and impairments:     Visit Diagnosis: Chronic bilateral low back pain, unspecified whether  sciatica present  Muscle weakness (generalized)  Other symptoms and signs involving the musculoskeletal system  Problem List Patient Active Problem List   Diagnosis Date Noted   DDD (degenerative disc disease), cervical 01/31/2021   Lumbar spondylosis 01/31/2021   Labral tear of hip, degenerative 01/31/2021    Jasmond River Rober Minion PT, MPH  03/26/2021, 9:33 AM  [facility] [address] [place], [place], [address] Phone: [phone]   Fax:  [phone]  Name: Kathryn Mosley MRN: [id number] Date of Birth: [birth year]

## 2021-03-26 NOTE — Patient Instructions (Signed)
Access Code: [email or web address]: [email or web address]: 07/11/2022Prepared by: Kynadi Dragos HoltExercises  Supine Transversus Abdominis Bracing with Pelvic Floor Contraction - 2 x daily - 7 x weekly - 1 sets - 10 reps - 10sec hold  Sit to Stand - 2 x daily - 7 x weekly - 1 sets - 10 reps - 3-5 sec hold  Wall Quarter Squat - 2 x daily - 7 x weekly - 1-2 sets - 10 reps - 5-10 sec hold  Shoulder External Rotation and Scapular Retraction with Resistance - 2 x daily - 7 x weekly - 10 reps - 3 sets  Scapular Retraction with Resistance - 2 x daily - 7 x weekly - 10 reps - 3 sets  Scapular Retraction with Resistance Advanced - 2 x daily - 7 x weekly - 10 reps - 3 sets

## 2021-03-29 ENCOUNTER — Encounter: Payer: Self-pay | Admitting: Rehabilitative and Restorative Service Providers"

## 2021-03-29 ENCOUNTER — Other Ambulatory Visit: Payer: Self-pay

## 2021-03-29 ENCOUNTER — Ambulatory Visit (INDEPENDENT_AMBULATORY_CARE_PROVIDER_SITE_OTHER): Payer: BC Managed Care – PPO | Admitting: Rehabilitative and Restorative Service Providers"

## 2021-03-29 DIAGNOSIS — R29898 Other symptoms and signs involving the musculoskeletal system: Secondary | ICD-10-CM

## 2021-03-29 DIAGNOSIS — M6281 Muscle weakness (generalized): Secondary | ICD-10-CM | POA: Diagnosis not present

## 2021-03-29 DIAGNOSIS — G8929 Other chronic pain: Secondary | ICD-10-CM

## 2021-03-29 DIAGNOSIS — M545 Low back pain, unspecified: Secondary | ICD-10-CM

## 2021-03-29 NOTE — Therapy (Signed)
[facility] [place] [address] [place] [address] [place], [place], [address] Phone: [phone]   Fax:  [phone]  Physical Therapy Treatment  Patient Details  Name: Shylynn Bruning MRN: [id number] Date of Birth: July 07, 1971 Referring Provider (PT): Dr Benjamin Stain   Encounter Date: 03/29/2021   PT End of Session - 03/29/21 0845     Visit Number 3    Number of Visits 12    Date for PT Re-Evaluation 05/02/21    PT Start Time 0844    PT Stop Time 0932    PT Time Calculation (min) 48 min    Activity Tolerance Patient tolerated treatment well             History reviewed. No pertinent past medical history.  History reviewed. No pertinent surgical history.  There were no vitals filed for this visit.   Subjective Assessment - 03/29/21 0845     Subjective Patient reports that she felt great afer DN. Has been working on her exercises. Feels "stuck" today. Not sure what she may have done last night to get tightened up again.    Currently in Pain? Yes    Pain Score 7     Pain Location Back    Pain Orientation Right;Left;Lower    Pain Descriptors / Indicators Aching    Pain Type Chronic pain                               [facility] Adult PT Treatment/Exercise - 03/29/21 0001       Lumbar Exercises: Stretches   Press Ups 5 reps   3-5 sec hold   Press Ups Limitations counter pressure by PT segmentally; then trial with strap around table holding hips flat as pt pressed up into extension      Lumbar Exercises: Standing   Scapular Retraction Strengthening;Both;10 reps;Theraband    Theraband Level (Scapular Retraction) Level 3 (Green)    Row Strengthening;Both;10 reps;Theraband    Theraband Level (Row) Level 4 (Blue)    Shoulder Extension Strengthening;Both;10 reps;Theraband    Theraband Level (Shoulder Extension) Level 4 (Blue)    Shoulder Adduction Limitations counter plane 60 sec x 5    Other Standing Lumbar Exercises  antirotation green x 10 each side    Other Standing Lumbar Exercises lat pull green TB x 15 reps      Lumbar Exercises: Supine   AB Set Limitations 3 part core 10 sec hold x 10-15 reps      Moist Heat Therapy   Number Minutes Moist Heat 10 Minutes    Moist Heat Location Lumbar Spine      Electrical Stimulation   Electrical Stimulation Location bilat lumbar    Electrical Stimulation Action TENS    Electrical Stimulation Parameters to tolerance    Electrical Stimulation Goals Pain;Tone      Manual Therapy   Manual therapy comments skilled palpation to assess response to DN and manual work through lumbar spine and bilat hips    Joint Mobilization PA mobs lumbar spine followed by prone press up with good improvement in mobility and report of decrease in pain or sensation of being "stuck"    Soft tissue mobilization STM bilat posterior hips              Trigger Point Dry Needling - 03/29/21 0001     Consent Given? Yes    Education Handout Provided Previously provided    Other Dry Needling bilat  Gluteus Medius Response Palpable increased muscle length    Gluteus Maximus Response Palpable increased muscle length    Lumbar multifidi Response Palpable increased muscle length                  PT Education - 03/29/21 0904     Education Details HEP    Person(s) Educated Patient    Methods Explanation;Demonstration;Tactile cues;Verbal cues;Handout    Comprehension Verbalized understanding;Returned demonstration;Verbal cues required;Tactile cues required              PT Short Term Goals - 03/21/21 1143       PT SHORT TERM GOAL #1   Title Patient will tolerate sitting or standing for ~ 1 hour with no increase in LBP    Time 6    Period Weeks    Status New    Target Date 05/02/21      PT SHORT TERM GOAL #2   Title Imprve core strength and stability with patient to demonstrate tolerance of core strengthening program for 30-40 minutes    Time 6    Period Weeks     Status New    Target Date 05/02/21      PT SHORT TERM GOAL #3   Title Patient to demonstrate and/or verbalize proper body mechanics for sitting; standing; positional changes    Time 6    Period Weeks    Status New    Target Date 05/02/21      PT SHORT TERM GOAL #4   Title Independent in HEP including community aquatic program as indicated    Time 6    Period Weeks    Status New    Target Date 05/02/21      PT SHORT TERM GOAL #5   Title Improve functional limitation score to 55    Time 6    Period Weeks    Status New    Target Date 05/02/21                      Plan - 03/29/21 0852     Clinical Impression Statement Continued pain at 7/10 and reports feeling "stuck" in upper back and LB. Added core work using UE's with TB. Cotninued manual work and DN.    Rehab Potential Good    PT Frequency 2x / week    PT Duration 6 weeks    PT Treatment/Interventions ADLs/Self Care Home Management;Aquatic Therapy;Cryotherapy;Electrical Stimulation;Iontophoresis 4mg /ml Dexamethasone;Moist Heat;Ultrasound;Functional mobility training;Therapeutic activities;Therapeutic exercise;Balance training;Neuromuscular re-education;Patient/family education;Manual techniques;Passive range of motion;Dry needling;Taping    PT Next Visit Plan review HEP - progress with core stabilization (may include plank, TB antirotation and UE exercises, weights with functional strengthening);continue trial of DN/manual therapy; modalities as indicated _ has TENS    PT Home Exercise Plan [email or web address]    Consulted and Agree with Plan of Care Patient             Patient will benefit from skilled therapeutic intervention in order to improve the following deficits and impairments:     Visit Diagnosis: Chronic bilateral low back pain, unspecified whether sciatica present  Muscle weakness (generalized)  Other symptoms and signs involving the musculoskeletal system     Problem List Patient Active  Problem List   Diagnosis Date Noted   DDD (degenerative disc disease), cervical 01/31/2021   Lumbar spondylosis 01/31/2021   Labral tear of hip, degenerative 01/31/2021    Katiana Ruland 02/02/2021 PT, MPH  03/29/2021, 9:27 AM  [facility] [address] [place] [address] [place], [place], [address] Phone: [phone]   Fax:  [phone]  Name: Winnie Umali MRN: [id number] Date of Birth: [birth year]

## 2021-03-29 NOTE — Patient Instructions (Signed)
Access Code: [email or web address]: [email or web address]: 07/14/2022Prepared by: Juris Gosnell HoltExercises  Supine Transversus Abdominis Bracing with Pelvic Floor Contraction - 2 x daily - 7 x weekly - 1 sets - 10 reps - 10sec hold  Sit to Stand - 2 x daily - 7 x weekly - 1 sets - 10 reps - 3-5 sec hold  Wall Quarter Squat - 2 x daily - 7 x weekly - 1-2 sets - 10 reps - 5-10 sec hold  Shoulder External Rotation and Scapular Retraction with Resistance - 2 x daily - 7 x weekly - 10 reps - 3 sets  Scapular Retraction with Resistance - 2 x daily - 7 x weekly - 10 reps - 3 sets  Scapular Retraction with Resistance Advanced - 2 x daily - 7 x weekly - 10 reps - 3 sets  Standing Lat Pull Down with Resistance - Elbows Bent - 2 x daily - 7 x weekly - 1 sets - 10 reps - 3 sec hold  Anti-Rotation Lateral Stepping with Press - 2 x daily - 7 x weekly - 1-2 sets - 10 reps - 2-3 sec hold  Plank on Counter - 2 x daily - 7 x weekly - 1 sets - 3 reps - 30 sec hold

## 2021-04-02 ENCOUNTER — Encounter: Payer: BC Managed Care – PPO | Admitting: Rehabilitative and Restorative Service Providers"

## 2021-04-05 ENCOUNTER — Encounter: Payer: Self-pay | Admitting: Rehabilitative and Restorative Service Providers"

## 2021-04-05 ENCOUNTER — Other Ambulatory Visit: Payer: Self-pay

## 2021-04-05 ENCOUNTER — Ambulatory Visit (INDEPENDENT_AMBULATORY_CARE_PROVIDER_SITE_OTHER): Payer: BC Managed Care – PPO | Admitting: Rehabilitative and Restorative Service Providers"

## 2021-04-05 DIAGNOSIS — M6281 Muscle weakness (generalized): Secondary | ICD-10-CM | POA: Diagnosis not present

## 2021-04-05 DIAGNOSIS — R29898 Other symptoms and signs involving the musculoskeletal system: Secondary | ICD-10-CM | POA: Diagnosis not present

## 2021-04-05 DIAGNOSIS — M545 Low back pain, unspecified: Secondary | ICD-10-CM

## 2021-04-05 DIAGNOSIS — G8929 Other chronic pain: Secondary | ICD-10-CM | POA: Diagnosis not present

## 2021-04-05 NOTE — Patient Instructions (Addendum)
  Access Code: [email or web address] URL: [email or web address] Date: 04/05/2021 Prepared by: Corlis Leak  Exercises Supine Transversus Abdominis Bracing with Pelvic Floor Contraction - 2 x daily - 7 x weekly - 1 sets - 10 reps - 10sec hold Sit to Stand - 2 x daily - 7 x weekly - 1 sets - 10 reps - 3-5 sec hold Wall Quarter Squat - 2 x daily - 7 x weekly - 1-2 sets - 10 reps - 5-10 sec hold Shoulder External Rotation and Scapular Retraction with Resistance - 2 x daily - 7 x weekly - 10 reps - 3 sets Scapular Retraction with Resistance - 2 x daily - 7 x weekly - 10 reps - 3 sets Scapular Retraction with Resistance Advanced - 2 x daily - 7 x weekly - 10 reps - 3 sets Standing Lat Pull Down with Resistance - Elbows Bent - 2 x daily - 7 x weekly - 1 sets - 10 reps - 3 sec hold Anti-Rotation Lateral Stepping with Press - 2 x daily - 7 x weekly - 1-2 sets - 10 reps - 2-3 sec hold Plank on Counter - 2 x daily - 7 x weekly - 1 sets - 3 reps - 30 sec hold Hip Flexor Stretch at Edge of Bed - 2 x daily - 7 x weekly - 1 sets - 3 reps - 30 sec hold Side Stepping with Resistance at Thighs - 1 x daily - 7 x weekly - 1 sets - 10 reps

## 2021-04-05 NOTE — Therapy (Addendum)
[place], [place], [address] Phone: [phone]   Fax:  [phone]  Physical Therapy Treatment  Patient Details  Name: Kathryn Mosley MRN: [id number] Date of Birth: 1971-05-11 Referring Provider (PT): Dr Dianah Field   Encounter Date: 04/05/2021   PT End of Session - 04/05/21 0848     Visit Number 4    Number of Visits 12    Date for PT Re-Evaluation 05/02/21    PT Start Time 0845    PT Stop Time 0933    PT Time Calculation (min) 48 min    Activity Tolerance Patient tolerated treatment well             History reviewed. No pertinent past medical history.  History reviewed. No pertinent surgical history.  There were no vitals filed for this visit.   Subjective Assessment - 04/05/21 0849     Subjective Patient reports that she has been sitting a lot in the past several days. She also lifted a washer and dryer yesterday and bailed water from the washer when it would not work. Now having more pain in her hips. Tight hip flexors making it difficult to walk(painful)    Currently in Pain? Yes    Pain Score 7     Pain Location Back    Pain Orientation Right;Left;Lower    Pain Descriptors / Indicators Aching    Pain Type Chronic pain    Pain Radiating Towards into Lt > Rt hips    Pain Onset More than a month ago    Pain Frequency Constant                [facility] PT Assessment - 04/05/21 0001       Assessment   Medical Diagnosis Lumbar spondylosis    Referring Provider (PT) Dr Dianah Field    Onset Date/Surgical Date 09/16/12    Hand Dominance Right    Next MD Visit after 4 PT visits    Prior Therapy yes for LBP hip pain neck pain      Palpation   Palpation comment tightness Lt > Rt hip flexors; tightness Lt > Rt gluts/piriformis                           [facility] Adult PT Treatment/Exercise - 04/05/21 0001       Lumbar Exercises: Stretches   Press Ups 10 reps     Press Ups Limitations counter pressure by PT segmentally; then trial with strap around table holding hips flat as pt pressed up into extension      Lumbar Exercises: Standing   Other Standing Lumbar Exercises side steps 10 feet green TB x 10 reps; lat pull green TB x 15 reps      Lumbar Exercises: Supine   AB Set Limitations 3 part core 10 sec hold x 10-15 reps      Moist Heat Therapy   Number Minutes Moist Heat 10 Minutes    Moist Heat Location Lumbar Spine      Electrical Stimulation   Electrical Stimulation Location bilat lumbar    Electrical Stimulation Action TENS    Electrical Stimulation Parameters to tolerance    Electrical Stimulation Goals Pain;Tone      Manual Therapy   Manual therapy comments skilled palpation to assess response to DN and manual work through lumbar spine and bilat hips    Joint Mobilization PA mobs lumbar spine followed by prone press  up with good improvement in mobility and report of decrease in pain or sensation of being "stuck"    Soft tissue mobilization STM bilat posterior hips              Trigger Point Dry Needling - 04/05/21 0001     Consent Given? Yes    Education Handout Provided Previously provided    Other Dry Needling bilat    Gluteus Medius Response Palpable increased muscle length    Gluteus Maximus Response Palpable increased muscle length    Lumbar multifidi Response Palpable increased muscle length                  PT Education - 04/05/21 0857     Education Details HEP    Person(s) Educated Patient    Methods Explanation;Demonstration;Tactile cues;Verbal cues;Handout    Comprehension Verbalized understanding;Returned demonstration;Verbal cues required;Tactile cues required              PT Short Term Goals - 03/21/21 1143       PT SHORT TERM GOAL #1   Title Patient will tolerate sitting or standing for ~ 1 hour with no increase in LBP    Time 6    Period Weeks    Status New    Target Date 05/02/21       PT SHORT TERM GOAL #2   Title Imprve core strength and stability with patient to demonstrate tolerance of core strengthening program for 30-40 minutes    Time 6    Period Weeks    Status New    Target Date 05/02/21      PT SHORT TERM GOAL #3   Title Patient to demonstrate and/or verbalize proper body mechanics for sitting; standing; positional changes    Time 6    Period Weeks    Status New    Target Date 05/02/21      PT SHORT TERM GOAL #4   Title Independent in HEP including community aquatic program as indicated    Time 6    Period Weeks    Status New    Target Date 05/02/21      PT SHORT TERM GOAL #5   Title Improve functional limitation score to 55    Time 6    Period Weeks    Status New    Target Date 05/02/21                      Plan - 04/05/21 0851     Clinical Impression Statement Increased stressful activities over the past few days which would contribute to increased pain in back and hips. Palpable tightness bilat hip flexors Lt > Rt. Added hip flexor stretch in supine and sitting. Continue with core stabilization; dry needling; manual work.    Rehab Potential Good    PT Frequency 2x / week    PT Duration 6 weeks    PT Treatment/Interventions ADLs/Self Care Home Management;Aquatic Therapy;Cryotherapy;Electrical Stimulation;Iontophoresis 39m/ml Dexamethasone;Moist Heat;Ultrasound;Functional mobility training;Therapeutic activities;Therapeutic exercise;Balance training;Neuromuscular re-education;Patient/family education;Manual techniques;Passive range of motion;Dry needling;Taping    PT Next Visit Plan review HEP - progress with core stabilization (may include plank, TB antirotation and UE exercises, weights with functional strengthening);continue trial of DN/manual therapy; modalities as indicated _ has TENS    PT Home Exercise Plan [email or web address]    Consulted and Agree with Plan of Care Patient             Patient will benefit from skilled  therapeutic intervention  in order to improve the following deficits and impairments:     Visit Diagnosis: Chronic bilateral low back pain, unspecified whether sciatica present  Muscle weakness (generalized)  Other symptoms and signs involving the musculoskeletal system     Problem List Patient Active Problem List   Diagnosis Date Noted   DDD (degenerative disc disease), cervical 01/31/2021   Lumbar spondylosis 01/31/2021   Labral tear of hip, degenerative 01/31/2021    Teddie Mehta Nilda Simmer PT, MPH  04/05/2021, 9:31 AM  [facility] [place], [place], [address] Phone: [phone]   Fax:  [phone]  Name: Kathryn Mosley MRN: [id number] Date of Birth: 1970/10/06   PHYSICAL THERAPY DISCHARGE SUMMARY  Visits from Start of Care: 4  Current functional level related to goals / functional outcomes: See progress note for discharge status   Remaining deficits: Unknown    Education / Equipment: HEP   Patient agrees to discharge. Patient goals were partially met. Patient is being discharged due to not returning since the last visit.  Quenton Recendez P. Helene Kelp PT, MPH 05/09/21 11:32 AM

## 2021-05-11 ENCOUNTER — Other Ambulatory Visit: Payer: Self-pay | Admitting: Sports Medicine

## 2021-05-11 DIAGNOSIS — M47816 Spondylosis without myelopathy or radiculopathy, lumbar region: Secondary | ICD-10-CM

## 2021-05-11 DIAGNOSIS — M5030 Other cervical disc degeneration, unspecified cervical region: Secondary | ICD-10-CM

## 2021-05-11 DIAGNOSIS — M24159 Other articular cartilage disorders, unspecified hip: Secondary | ICD-10-CM

## 2021-05-16 ENCOUNTER — Other Ambulatory Visit: Payer: Self-pay

## 2021-05-16 ENCOUNTER — Encounter: Payer: Self-pay | Admitting: Rehabilitative and Restorative Service Providers"

## 2021-05-16 ENCOUNTER — Ambulatory Visit (INDEPENDENT_AMBULATORY_CARE_PROVIDER_SITE_OTHER): Payer: BC Managed Care – PPO | Admitting: Rehabilitative and Restorative Service Providers"

## 2021-05-16 DIAGNOSIS — M6281 Muscle weakness (generalized): Secondary | ICD-10-CM

## 2021-05-16 DIAGNOSIS — G8929 Other chronic pain: Secondary | ICD-10-CM

## 2021-05-16 DIAGNOSIS — M545 Low back pain, unspecified: Secondary | ICD-10-CM | POA: Diagnosis not present

## 2021-05-16 DIAGNOSIS — R29898 Other symptoms and signs involving the musculoskeletal system: Secondary | ICD-10-CM | POA: Diagnosis not present

## 2021-05-16 NOTE — Patient Instructions (Signed)

## 2021-05-16 NOTE — Therapy (Signed)
[facility] [place] [address] [place], [place], [address] Phone: [phone]   Fax:  [phone]  Physical Therapy Treatment  Patient Details  Name: Raseel Jans MRN: [id number] Date of Birth: [birth year] Referring Provider (PT): Dr Benjamin Stain   Encounter Date: 05/16/2021   PT End of Session - 05/16/21 0845     Visit Number 1    Number of Visits 12    Date for PT Re-Evaluation 06/27/21    PT Start Time 0846    PT Stop Time 0930    PT Time Calculation (min) 44 min    Activity Tolerance Patient tolerated treatment well             History reviewed. No pertinent past medical history.  History reviewed. No pertinent surgical history.  There were no vitals filed for this visit.   Subjective Assessment - 05/16/21 0847     Subjective Patient reports that she has been traveling and doing a lot of riding and driving (17 hours one way). She has been out of town for about a month. She has experienced increased pain in  LB and neck including nerve pain due to travel and changes in activity level.    Patient Stated Goals stand and sit without pain for 1 hour    Currently in Pain? Yes    Pain Score 8     Pain Location Back    Pain Orientation Right;Left;Lower    Pain Descriptors / Indicators Aching;Pressure   Stuck   Pain Type Chronic pain    Pain Radiating Towards into Lt > Rt hips    Pain Onset More than a month ago    Pain Frequency Constant    Aggravating Factors  sitting; prolonged standing; back bending; lifting; turning; getting in and out of the car    Pain Relieving Factors forward bending                [facility] PT Assessment - 05/16/21 0001       Assessment   Medical Diagnosis Lumbar spondylosis; cervical dysfunction    Referring Provider (PT) Dr Benjamin Stain    Onset Date/Surgical Date 09/16/12    Hand Dominance Right    Prior Therapy yes for LBP hip pain neck pain      Sensation   Additional Comments  intermittent numbness/tingling into toes and into arms to hands and fingers      Posture/Postural Control   Posture Comments forward rounded posture and alignment in sitting; shoulders rounded in standing      AROM   Cervical Flexion 41 sharp pain in the neck pulling to T spine    Cervical Extension 43 sharp pain in the neck    Cervical - Right Side Bend 36 pain Rt neck    Cervical - Left Side Bend 34 pain in neck    Cervical - Right Rotation 63    Cervical - Left Rotation 60 tightn in the neck    Lumbar Flexion 100% palms to floor    Lumbar Extension 55% LBP    Lumbar - Right Side Bend 85% pain in Lt LB    Lumbar - Left Side Bend 75% discomfort in Lt LB    Lumbar - Right Rotation 65% pain center LB    Lumbar - Left Rotation 55% pain in the Lt LB      Strength   Right Hip ABduction 4+/5   painful   Left Hip Extension 5/5   painful  Left Hip ABduction 5/5      Flexibility   Hamstrings WFL's    Quadriceps WFL's    ITB WFL's    Piriformis WFL's      Palpation   Spinal mobility hypomobile lumbar spine with PA mobs greatest at ~ L3/4    SI assessment  pain with palpation    Palpation comment tightness Lt > Rt hip flexors; tightness Lt > Rt gluts/piriformis; tightness through bilat cervical and thoracic spine musculature, upper trap                           [facility] Adult PT Treatment/Exercise - 05/16/21 0001       Lumbar Exercises: Stretches   Press Ups 10 reps      Lumbar Exercises: Quadruped   [place] 5 reps    Other Quadruped Lumbar Exercises sit back to child's pose x 3 reps 20 sec hold      Moist Heat Therapy   Number Minutes Moist Heat 10 Minutes    Moist Heat Location Lumbar Spine;Cervical      Electrical Stimulation   Electrical Stimulation Location bilat cervical and upper traps    Electrical Stimulation Action TENS    Electrical Stimulation Parameters to tolerance    Electrical Stimulation Goals Pain;Tone      Manual Therapy    Manual therapy comments skilled palpation to assess response to DN and manual work through lumbar spine and bilat hips    Soft tissue mobilization STM bilat posterior hips              Trigger Point Dry Needling - 05/16/21 0001     Consent Given? Yes    Education Handout Provided Yes    Other Dry Needling bilat    Gluteus Medius Response Palpable increased muscle length    Gluteus Maximus Response Palpable increased muscle length    Lumbar multifidi Response Palpable increased muscle length                  PT Education - 05/16/21 0923     Education Details POC; HEP; DN    Person(s) Educated Patient    Methods Explanation;Handout    Comprehension Verbalized understanding              PT Short Term Goals - 05/16/21 0846       PT SHORT TERM GOAL #1   Title Patient will tolerate sitting or standing for ~ 1 hour with no increase in LBP    Time 6    Period Weeks    Status Revised    Target Date 06/27/21      PT SHORT TERM GOAL #2   Title Imprve core strength and stability with patient to demonstrate tolerance of core strengthening program for 30-40 minutes    Time 6    Period Weeks    Status Revised    Target Date 06/27/21      PT SHORT TERM GOAL #3   Title Patient to demonstrate and/or verbalize proper body mechanics for sitting; standing; positional changes    Time 6    Period Weeks    Status Revised    Target Date 06/27/21      PT SHORT TERM GOAL #4   Title Independent in HEP including community aquatic program as indicated    Time 6    Period Weeks    Status Revised    Target Date 06/27/21  PT SHORT TERM GOAL #5   Title Improve functional limitation score to 55    Time 6    Period Weeks    Status Revised    Target Date 06/27/21                      Plan - 05/16/21 5465     Clinical Impression Statement Patient presents with flare up of LBP and Rt > Lt posterior hip pain as well as cervical and UE pain following a month  of travel including prolonged sitting and changes in activity level. She has poor posture; limited trunk mobility/ROM; muscular tightness to palpation; decreased activity tolerance; pain on a constant basis. Patient will benefit from PT to address problems identified.    Stability/Clinical Decision Making Stable/Uncomplicated    Clinical Decision Making Low    Rehab Potential Good    PT Frequency 2x / week    PT Duration 6 weeks    PT Treatment/Interventions ADLs/Self Care Home Management;Aquatic Therapy;Cryotherapy;Electrical Stimulation;Iontophoresis 4mg /ml Dexamethasone;Moist Heat;Ultrasound;Functional mobility training;Therapeutic activities;Therapeutic exercise;Balance training;Neuromuscular re-education;Patient/family education;Manual techniques;Passive range of motion;Dry needling;Taping    PT Next Visit Plan progress with core stabilization (may include plank, TB antirotation and UE exercises, weights with functional strengthening);continue trial of DN/manual therapy; modalities as indicated _ has TENS; add cervical stabilization; posterior soulder girdle strengthening    PT Home Exercise Plan [email or web address]    Consulted and Agree with Plan of Care Patient             Patient will benefit from skilled therapeutic intervention in order to improve the following deficits and impairments:  Decreased activity tolerance, Pain, Hypomobility, Improper body mechanics, Decreased strength, Postural dysfunction  Visit Diagnosis: Chronic bilateral low back pain, unspecified whether sciatica present  Muscle weakness (generalized)  Other symptoms and signs involving the musculoskeletal system     Problem List Patient Active Problem List   Diagnosis Date Noted   DDD (degenerative disc disease), cervical 01/31/2021   Lumbar spondylosis 01/31/2021   Labral tear of hip, degenerative 01/31/2021    Meighan Treto 02/02/2021 PT, MPH  05/16/2021, 9:28 AM  [facility] [address] [place], [place], [place] Phone: [phone]   Fax:  [phone]  Name: Karly Pitter MRN: Betsey Amen Date of Birth: 19-Feb-1971

## 2021-05-23 ENCOUNTER — Other Ambulatory Visit: Payer: Self-pay

## 2021-05-23 ENCOUNTER — Ambulatory Visit (INDEPENDENT_AMBULATORY_CARE_PROVIDER_SITE_OTHER): Payer: BC Managed Care – PPO | Admitting: Rehabilitative and Restorative Service Providers"

## 2021-05-23 ENCOUNTER — Encounter: Payer: Self-pay | Admitting: Rehabilitative and Restorative Service Providers"

## 2021-05-23 DIAGNOSIS — M545 Low back pain, unspecified: Secondary | ICD-10-CM | POA: Diagnosis not present

## 2021-05-23 DIAGNOSIS — R29898 Other symptoms and signs involving the musculoskeletal system: Secondary | ICD-10-CM | POA: Diagnosis not present

## 2021-05-23 DIAGNOSIS — G8929 Other chronic pain: Secondary | ICD-10-CM | POA: Diagnosis not present

## 2021-05-23 DIAGNOSIS — M6281 Muscle weakness (generalized): Secondary | ICD-10-CM | POA: Diagnosis not present

## 2021-05-23 NOTE — Therapy (Signed)
[facility] [place] [address] [place], [place], [address] Phone: [phone]   Fax:  [phone]  Physical Therapy Treatment  Patient Details  Name: Irania Durell MRN: [id number] Date of Birth: Sep 24, 1970 Referring Provider (PT): Dr Benjamin Stain   Encounter Date: 05/23/2021   PT End of Session - 05/23/21 0852     Visit Number 2    Number of Visits 12    Date for PT Re-Evaluation 06/27/21    PT Start Time 0847    PT Stop Time 0930    PT Time Calculation (min) 43 min    Activity Tolerance Patient tolerated treatment well             History reviewed. No pertinent past medical history.  History reviewed. No pertinent surgical history.  There were no vitals filed for this visit.   Subjective Assessment - 05/23/21 0853     Subjective Patient reports that she has has terrible back and abdominal pain in the past several days. Xrays suggested a possible bowel blockage of constipation. She tried diet and OTC meds with no improvement. She was seen in the ED with abdominal CT showing no blockage. Pain in the back is terrible. She did have some improvement with the DN last visit.    Currently in Pain? Yes    Pain Score 9     Pain Location Back    Pain Orientation Right;Left;Lower    Pain Descriptors / Indicators Aching;Pressure    Pain Type Chronic pain    Pain Radiating Towards into Lt > Rt hips    Pain Onset More than a month ago    Pain Frequency Constant                               [facility] Adult PT Treatment/Exercise - 05/23/21 0001       Lumbar Exercises: Stretches   Hip Flexor Stretch Right;Left;3 reps;30 seconds    Hip Flexor Stretch Limitations trial sitting did not feel much stretch - better stretch with supine THomas stretch position      Lumbar Exercises: Aerobic   Nustep L5 x 5 min      Lumbar Exercises: Supine   AB Set Limitations 3 part core 10 sec hold x 10-15 reps      Moist Heat  Therapy   Number Minutes Moist Heat 10 Minutes    Moist Heat Location Lumbar Spine;Other (comment)   abdomen     Electrical Stimulation   Electrical Stimulation Location bilat lumbar and mid thoracic paraspinals    Electrical Stimulation Action TENs    Electrical Stimulation Parameters to tolerance    Electrical Stimulation Goals Pain;Tone      Manual Therapy   Other Manual Therapy deep tissue work bilat illicus and psoas musculature - pt supine LE's supported on bolster                  Upper Extremity Functional Index Score :   /80     PT Short Term Goals - 05/16/21 0846       PT SHORT TERM GOAL #1   Title Patient will tolerate sitting or standing for ~ 1 hour with no increase in LBP    Time 6    Period Weeks    Status Revised    Target Date 06/27/21      PT SHORT TERM GOAL #2   Title Imprve core strength and stability  with patient to demonstrate tolerance of core strengthening program for 30-40 minutes    Time 6    Period Weeks    Status Revised    Target Date 06/27/21      PT SHORT TERM GOAL #3   Title Patient to demonstrate and/or verbalize proper body mechanics for sitting; standing; positional changes    Time 6    Period Weeks    Status Revised    Target Date 06/27/21      PT SHORT TERM GOAL #4   Title Independent in HEP including community aquatic program as indicated    Time 6    Period Weeks    Status Revised    Target Date 06/27/21      PT SHORT TERM GOAL #5   Title Improve functional limitation score to 55    Time 6    Period Weeks    Status Revised    Target Date 06/27/21                      Plan - 05/23/21 4098     Clinical Impression Statement Patient returns today with increased pain in the LB and into the abdomen. She has been seen in MD office and ED for abdominal pain with recent CT scan negative. Patient does have palpable tightness through the iliopsoas musculature Rt > Lt Good response to manual work and  stretching for hip flexors. Will monitor response and adjust treatment accordingly. Patient will benefit from resuming core strengthening and stabilization exercise program.    Rehab Potential Good    PT Frequency 2x / week    PT Duration 6 weeks    PT Treatment/Interventions ADLs/Self Care Home Management;Aquatic Therapy;Cryotherapy;Electrical Stimulation;Iontophoresis 4mg /ml Dexamethasone;Moist Heat;Ultrasound;Functional mobility training;Therapeutic activities;Therapeutic exercise;Balance training;Neuromuscular re-education;Patient/family education;Manual techniques;Passive range of motion;Dry needling;Taping    PT Next Visit Plan assess response to work through the iliopsoas musculature; progress with core stabilization (may include plank, TB antirotation and UE exercises, weights with functional strengthening);continue DN/manual therapy; modalities as indicated _ has TENS; add cervical stabilization; posterior soulder girdle strengthening    PT Home Exercise Plan [email or web address]    Consulted and Agree with Plan of Care Patient             Patient will benefit from skilled therapeutic intervention in order to improve the following deficits and impairments:     Visit Diagnosis: Chronic bilateral low back pain, unspecified whether sciatica present  Muscle weakness (generalized)  Other symptoms and signs involving the musculoskeletal system     Problem List Patient Active Problem List   Diagnosis Date Noted   DDD (degenerative disc disease), cervical 01/31/2021   Lumbar spondylosis 01/31/2021   Labral tear of hip, degenerative 01/31/2021    Chevie Birkhead 02/02/2021, PT, MPH  05/23/2021, 9:33 AM  [facility] [address] [place], [place], [place] Phone: [phone]   Fax:  [phone]  Name: Brealynn Contino MRN: Betsey Amen Date of Birth: 04-27-1971

## 2021-05-29 ENCOUNTER — Encounter: Payer: BC Managed Care – PPO | Admitting: Rehabilitative and Restorative Service Providers"

## 2021-06-01 ENCOUNTER — Other Ambulatory Visit: Payer: Self-pay

## 2021-06-01 ENCOUNTER — Encounter: Payer: Self-pay | Admitting: Rehabilitative and Restorative Service Providers"

## 2021-06-01 ENCOUNTER — Ambulatory Visit (INDEPENDENT_AMBULATORY_CARE_PROVIDER_SITE_OTHER): Payer: BC Managed Care – PPO | Admitting: Rehabilitative and Restorative Service Providers"

## 2021-06-01 DIAGNOSIS — M545 Low back pain, unspecified: Secondary | ICD-10-CM | POA: Diagnosis not present

## 2021-06-01 DIAGNOSIS — G8929 Other chronic pain: Secondary | ICD-10-CM

## 2021-06-01 DIAGNOSIS — M6281 Muscle weakness (generalized): Secondary | ICD-10-CM

## 2021-06-01 DIAGNOSIS — R29898 Other symptoms and signs involving the musculoskeletal system: Secondary | ICD-10-CM | POA: Diagnosis not present

## 2021-06-01 NOTE — Patient Instructions (Signed)
Access Code: [email or web address] URL: [email or web address] Date: 06/01/2021 Prepared by: Corlis Leak  Exercises Supine Transversus Abdominis Bracing with Pelvic Floor Contraction - 2 x daily - 7 x weekly - 1 sets - 10 reps - 10sec hold Sit to Stand - 2 x daily - 7 x weekly - 1 sets - 10 reps - 3-5 sec hold Wall Quarter Squat - 2 x daily - 7 x weekly - 1-2 sets - 10 reps - 5-10 sec hold Shoulder External Rotation and Scapular Retraction with Resistance - 2 x daily - 7 x weekly - 10 reps - 3 sets Scapular Retraction with Resistance - 2 x daily - 7 x weekly - 10 reps - 3 sets Scapular Retraction with Resistance Advanced - 2 x daily - 7 x weekly - 10 reps - 3 sets Standing Lat Pull Down with Resistance - Elbows Bent - 2 x daily - 7 x weekly - 1 sets - 10 reps - 3 sec hold Anti-Rotation Lateral Stepping with Press - 2 x daily - 7 x weekly - 1-2 sets - 10 reps - 2-3 sec hold Plank on Counter - 2 x daily - 7 x weekly - 1 sets - 3 reps - 30 sec hold Hip Flexor Stretch at Edge of Bed - 2 x daily - 7 x weekly - 1 sets - 3 reps - 30 sec hold Side Stepping with Resistance at Thighs - 1 x daily - 7 x weekly - 1 sets - 10 reps Beginner Bridge - 2 x daily - 7 x weekly - 1 sets - 10 reps - 3-5 sec hold Supine Scapular Protraction in Flexion with Dumbbells - 1 x daily - 7 x weekly - 1 sets - 10 reps - 2-3 sec hold

## 2021-06-01 NOTE — Therapy (Signed)
[facility] [place] [address] [place], [place], [address] Phone: [phone]   Fax:  [phone]  Physical Therapy Treatment  Patient Details  Name: Kathryn Mosley MRN: [id number] Date of Birth: 04-04-1971 Referring Provider (PT): Dr Benjamin Stain   Encounter Date: 06/01/2021   PT End of Session - 06/01/21 0852     Visit Number 3    Number of Visits 12    Date for PT Re-Evaluation 06/27/21    PT Start Time 0848    PT Stop Time 0938    PT Time Calculation (min) 50 min    Activity Tolerance Patient tolerated treatment well             History reviewed. No pertinent past medical history.  History reviewed. No pertinent surgical history.  There were no vitals filed for this visit.   Subjective Assessment - 06/01/21 0853     Subjective Patient reports that she had a colonoscopy this week that was difficult. She has continued irritation in digestive system. She does feel some improvement in back pain. Psoas work was helpful. She feels she is ready for exercises and getting in to her routine.    Currently in Pain? Yes    Pain Score 5     Pain Location Back    Pain Orientation Right;Left;Lower    Pain Descriptors / Indicators Aching;Pressure    Pain Type Chronic pain    Pain Onset More than a month ago    Pain Frequency Constant                [facility] PT Assessment - 06/01/21 0001       Assessment   Medical Diagnosis Lumbar spondylosis; cervical dysfunction    Referring Provider (PT) Dr Benjamin Stain    Onset Date/Surgical Date 09/16/12    Hand Dominance Right    Prior Therapy yes for LBP hip pain neck pain      Posture/Postural Control   Posture Comments forward rounded posture and alignment in sitting; shoulders rounded in standing      Palpation   SI assessment  pain with palpation    Palpation comment tightness Lt > Rt hip flexors; tightness Lt > Rt gluts/piriformis; tightness through bilat cervical and  thoracic spine musculature, upper trap                           [facility] Adult PT Treatment/Exercise - 06/01/21 0001       Lumbar Exercises: Stretches   Hip Flexor Stretch Right;Left;3 reps;30 seconds      Lumbar Exercises: Aerobic   Nustep L6 x 5 min      Lumbar Exercises: Supine   AB Set Limitations 3 part core 10 sec hold x 10-15 reps    Bridge 10 reps;3 seconds    Other Supine Lumbar Exercises shoulder flexion 3# wt each UE - alternating UE's starting at 90 deg flexion moving to ~ 130 deg    Other Supine Lumbar Exercises UE press hold 10 sec x 10 reps      Moist Heat Therapy   Number Minutes Moist Heat 10 Minutes    Moist Heat Location Lumbar Spine;Other (comment)   abdomen     Electrical Stimulation   Electrical Stimulation Location bilat lumbar; Rt anterior hip    Electrical Stimulation Action TENS    Electrical Stimulation Parameters to tolerance    Electrical Stimulation Goals Pain;Tone      Manual Therapy  Manual therapy comments skilled palpation to assess response to DN and manual work through lumbar spine and bilat hips    Other Manual Therapy deep tissue work bilat illicus and psoas musculature - pt supine LE's supported on bolster              Trigger Point Dry Needling - 06/01/21 0001     Consent Given? Yes    Education Handout Provided Previously provided    Other Dry Needling Rt    Iliacus Response Palpable increased muscle length                   PT Education - 06/01/21 0903     Education Details HEP    Person(s) Educated Patient    Methods Explanation;Demonstration;Tactile cues;Verbal cues;Handout    Comprehension Verbalized understanding;Returned demonstration;Verbal cues required;Tactile cues required              PT Short Term Goals - 05/16/21 0846       PT SHORT TERM GOAL #1   Title Patient will tolerate sitting or standing for ~ 1 hour with no increase in LBP    Time 6    Period Weeks    Status  Revised    Target Date 06/27/21      PT SHORT TERM GOAL #2   Title Imprve core strength and stability with patient to demonstrate tolerance of core strengthening program for 30-40 minutes    Time 6    Period Weeks    Status Revised    Target Date 06/27/21      PT SHORT TERM GOAL #3   Title Patient to demonstrate and/or verbalize proper body mechanics for sitting; standing; positional changes    Time 6    Period Weeks    Status Revised    Target Date 06/27/21      PT SHORT TERM GOAL #4   Title Independent in HEP including community aquatic program as indicated    Time 6    Period Weeks    Status Revised    Target Date 06/27/21      PT SHORT TERM GOAL #5   Title Improve functional limitation score to 55    Time 6    Period Weeks    Status Revised    Target Date 06/27/21                      Plan - 06/01/21 0855     Clinical Impression Statement Continued digestive disorder. Patient is scheduled to see women's health specialist for further assessment. Continued LB and spine pain and discomfort. Added core stabilization exercises in supine to avoid irritation of knees.    Rehab Potential Good    PT Frequency 2x / week    PT Duration 6 weeks    PT Treatment/Interventions ADLs/Self Care Home Management;Aquatic Therapy;Cryotherapy;Electrical Stimulation;Iontophoresis 4mg /ml Dexamethasone;Moist Heat;Ultrasound;Functional mobility training;Therapeutic activities;Therapeutic exercise;Balance training;Neuromuscular re-education;Patient/family education;Manual techniques;Passive range of motion;Dry needling;Taping    PT Next Visit Plan continue work through the iliopsoas musculature; progress with core stabilization (may include plank, TB antirotation and UE exercises, weights with functional strengthening);continue DN/manual therapy; modalities as indicated _ has TENS; add cervical stabilization; posterior shoulder girdle strengthening    PT Home Exercise Plan [email or web address]     Consulted and Agree with Plan of Care Patient             Patient will benefit from skilled therapeutic intervention in order to improve the following deficits and impairments:  Visit Diagnosis: Chronic bilateral low back pain, unspecified whether sciatica present  Muscle weakness (generalized)  Other symptoms and signs involving the musculoskeletal system     Problem List Patient Active Problem List   Diagnosis Date Noted   DDD (degenerative disc disease), cervical 01/31/2021   Lumbar spondylosis 01/31/2021   Labral tear of hip, degenerative 01/31/2021    Johnavon Mcclafferty Rober Minion, PT, MPH  06/01/2021, 9:30 AM  [facility] [address] [place], [place], [address] Phone: [phone]   Fax:  [phone]  Name: Kathryn Mosley MRN: [id number] Date of Birth: 06-06-1971

## 2021-06-12 ENCOUNTER — Encounter: Payer: BC Managed Care – PPO | Admitting: Rehabilitative and Restorative Service Providers"

## 2021-06-20 ENCOUNTER — Encounter: Payer: Self-pay | Admitting: Rehabilitative and Restorative Service Providers"

## 2021-06-20 ENCOUNTER — Ambulatory Visit (INDEPENDENT_AMBULATORY_CARE_PROVIDER_SITE_OTHER): Payer: BC Managed Care – PPO | Admitting: Rehabilitative and Restorative Service Providers"

## 2021-06-20 ENCOUNTER — Other Ambulatory Visit: Payer: Self-pay

## 2021-06-20 DIAGNOSIS — M545 Low back pain, unspecified: Secondary | ICD-10-CM | POA: Diagnosis not present

## 2021-06-20 DIAGNOSIS — G8929 Other chronic pain: Secondary | ICD-10-CM | POA: Diagnosis not present

## 2021-06-20 DIAGNOSIS — M6281 Muscle weakness (generalized): Secondary | ICD-10-CM

## 2021-06-20 DIAGNOSIS — R29898 Other symptoms and signs involving the musculoskeletal system: Secondary | ICD-10-CM

## 2021-06-20 NOTE — Therapy (Addendum)
[place] [address] [place], [place], [address] Phone: [phone]   Fax:  [phone]  Physical Therapy Treatment And discharge summary   PHYSICAL THERAPY DISCHARGE SUMMARY  Visits from Start of Care: 4  Current functional level related to goals / functional outcomes: See progress note for discharge status   Remaining deficits: Continued pain on an intermittent    Education / Equipment: HEP    Patient agrees to discharge. Patient goals were partially met. Patient is being discharged due to  continued pain. Will schedule injection.   Correen Bubolz P. Helene Kelp PT, MPH 07/12/21 12:53 PM   Patient Details  Name: Kathryn Mosley MRN: [id number] Date of Birth: 29-Jul-1971 Referring Provider (PT): Dr Dianah Field   Encounter Date: 06/20/2021   PT End of Session - 06/20/21 0941     Visit Number 4    Number of Visits 12    Date for PT Re-Evaluation 06/27/21    PT Start Time 0936    PT Stop Time 1024    PT Time Calculation (min) 48 min    Activity Tolerance Patient tolerated treatment well             History reviewed. No pertinent past medical history.  History reviewed. No pertinent surgical history.  There were no vitals filed for this visit.   Subjective Assessment - 06/20/21 0942     Subjective Patient reports that she has been having increased pain in the past few days. 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    Currently in Pain? Yes    Pain Score 9     Pain Location Back    Pain Orientation Right;Left;Lower    Pain Descriptors / Indicators Aching;Pressure    Pain Type Chronic pain    Pain Radiating Towards into Lt > Rt hips    Pain Onset More than a month ago                [facility] PT Assessment - 06/20/21 0001       Assessment   Medical Diagnosis Lumbar  spondylosis; cervical dysfunction    Referring Provider (PT) Dr Dianah Field    Onset Date/Surgical Date 09/16/12    Hand Dominance Right    Next MD Visit to schedule    Prior Therapy yes for LBP hip pain neck pain      Posture/Postural Control   Posture Comments forward rounded posture and alignment in sitting; shoulders rounded in standing      AROM   Lumbar Flexion 100% palms to floor    Lumbar Extension 55% LBP    Lumbar - Right Side Bend 85% pain in Lt LB    Lumbar - Left Side Bend 75% discomfort in Lt LB    Lumbar - Right Rotation 65% pain center LB    Lumbar - Left Rotation 55% pain in the Lt LB      Strength   Right Hip Extension 5/5    Right Hip ABduction 4+/5   painful   Left Hip Extension 5/5   painful   Left Hip ABduction 5/5      Flexibility   Hamstrings WFL's    Quadriceps WFL's    ITB WFL's    Piriformis WFL's      Palpation   SI assessment  pain with palpation  Palpation comment tightness Lt > Rt psoas/hip flexors; tightness Lt > Rt gluts/piriformis; tightness through bilat cervical and thoracic spine musculature, upper trap                           [facility] Adult PT Treatment/Exercise - 06/20/21 0001       Transfers   Transfer Cueing \      Lumbar Exercises: Stretches   Hip Flexor Stretch Right;Left;3 reps;30 seconds    Hip Flexor Stretch Limitations supine - [employer] 10 reps    Press Ups Limitations overpressure by PT      Lumbar Exercises: Aerobic   Nustep L6 x 5 min      Lumbar Exercises: Standing   Wall Slides Limitations HEP    Row Limitations HEP    Shoulder Extension Limitations HEP    Other Standing Lumbar Exercises antirotationTB exercise HEP      Lumbar Exercises: Supine   AB Set Limitations 3 part core 10 sec hold x 10-15 reps    Bridge 10 reps;3 seconds    Other Supine Lumbar Exercises shoulder flexion 3# wt each UE - alternating UE's starting at 90 deg flexion moving to ~ 130 deg    Other Supine  Lumbar Exercises UE press hold 10 sec x 10 reps      Moist Heat Therapy   Number Minutes Moist Heat 10 Minutes    Moist Heat Location Lumbar Spine;Other (comment)   abdomen     Electrical Stimulation   Electrical Stimulation Location bilat lumbar; Rt anterior hip    Electrical Stimulation Action TENS    Electrical Stimulation Parameters to tolerance    Electrical Stimulation Goals Pain;Tone      Manual Therapy   Other Manual Therapy deep tissue work bilat illicus and psoas musculature - pt supine LE's supported on bolster                       PT Short Term Goals - 06/20/21 1031       PT SHORT TERM GOAL #1   Title Patient will tolerate sitting or standing for ~ 1 hour with no increase in LBP    Time 6    Period Weeks    Status Partially Met    Target Date 06/27/21      PT SHORT TERM GOAL #2   Title Improve core strength and stability with patient to demonstrate tolerance of core strengthening program for 30-40 minutes    Time 6    Period Weeks    Status Not Met    Target Date 06/27/21      PT SHORT TERM GOAL #3   Title Patient to demonstrate and/or verbalize proper body mechanics for sitting; standing; positional changes    Time 6    Period Weeks    Status On-going    Target Date 06/27/21      PT SHORT TERM GOAL #4   Title Independent in HEP including community aquatic program as indicated    Time 6    Period Weeks    Status Partially Met    Target Date 06/27/21      PT SHORT TERM GOAL #5   Title Improve functional limitation score to 55    Time 6    Period Weeks    Status Unable to assess    Target Date 06/27/21  Plan - 06/20/21 1023     Clinical Impression Statement Patient reports fewer problems with digestive and women's issues but continued back and neck pain. She has added physical labor with a part time job which she feels has irritated back problems. Patient continues to have limited trunk mobilty and ROM;  muscular tightness most notably through the psoas and hip flexor musculature. She has an extensive HEP to work on core stabilization and strengthening. Patient achieves temporary relief of symptoms with manual work and DN but no lasting changes. She will schedule RTD to discuss injection. On hold for PT at this time.    Rehab Potential Good    PT Frequency 2x / week    PT Duration 6 weeks    PT Treatment/Interventions ADLs/Self Care Home Management;Aquatic Therapy;Cryotherapy;Electrical Stimulation;Iontophoresis 4mg /ml Dexamethasone;Moist Heat;Ultrasound;Functional mobility training;Therapeutic activities;Therapeutic exercise;Balance training;Neuromuscular re-education;Patient/family education;Manual techniques;Passive range of motion;Dry needling;Taping    PT Next Visit Plan Hold PT Darthula to return to MD    PT Bethania and Agree with Plan of Care Patient             Patient will benefit from skilled therapeutic intervention in order to improve the following deficits and impairments:     Visit Diagnosis: Chronic bilateral low back pain, unspecified whether sciatica present  Muscle weakness (generalized)  Other symptoms and signs involving the musculoskeletal system     Problem List Patient Active Problem List   Diagnosis Date Noted   DDD (degenerative disc disease), cervical 01/31/2021   Lumbar spondylosis 01/31/2021   Labral tear of hip, degenerative 01/31/2021    Kally Cadden Nilda Simmer, PT, MPH  06/20/2021, 10:33 AM  [facility] [address] [place], [place], [address] Phone: [phone]   Fax:  [phone]  Name: Kathryn Mosley MRN: [id number] Date of Birth: [birth year]

## 2021-08-21 ENCOUNTER — Other Ambulatory Visit: Payer: Self-pay

## 2021-08-21 ENCOUNTER — Ambulatory Visit: Payer: BC Managed Care – PPO | Admitting: Sports Medicine

## 2021-08-21 DIAGNOSIS — M5030 Other cervical disc degeneration, unspecified cervical region: Secondary | ICD-10-CM | POA: Diagnosis not present

## 2021-08-21 DIAGNOSIS — M47816 Spondylosis without myelopathy or radiculopathy, lumbar region: Secondary | ICD-10-CM | POA: Diagnosis not present

## 2021-08-21 NOTE — Assessment & Plan Note (Signed)
Had a really good response to a C7-T1 epidural back in the summertime, repeating.

## 2021-08-21 NOTE — Assessment & Plan Note (Signed)
This pleasant 50 year old female returns, she has chronic low back pain, pain is predominately axial and discogenic, we have tried Celebrex 200 twice daily, formal physical therapy, she has had some improvements but insufficiently enough. She tends to have significant difficulty when riding in the car for long rides and some pain when rollerskating. We never did a lumbar epidural so I think we will proceed this time, she does have some desiccation from L2-L5, we will do an L4-L5 interlaminar epidural, right-sided. Return to see me in a virtual telephone call visit 4 weeks after the epidural

## 2021-08-21 NOTE — Progress Notes (Signed)
    Procedures performed today:    None.  Independent interpretation of notes and tests performed by another provider:   None.  Brief History, Exam, Impression, and Recommendations:    Lumbar spondylosis This pleasant 50 year old female returns, she has chronic low back pain, pain is predominately axial and discogenic, we have tried Celebrex 200 twice daily, formal physical therapy, she has had some improvements but insufficiently enough. She tends to have significant difficulty when riding in the car for long rides and some pain when rollerskating. We never did a lumbar epidural so I think we will proceed this time, she does have some desiccation from L2-L5, we will do an L4-L5 interlaminar epidural, right-sided. Return to see me in a virtual telephone call visit 4 weeks after the epidural  DDD (degenerative disc disease), cervical Had a really good response to a C7-T1 epidural back in the summertime, repeating.    ___________________________________________ Ihor Austin. Benjamin Stain, M.D., ABFM., CAQSM. Primary Care and Sports Medicine [facility]  Adjunct Instructor of Family Medicine  [employer]

## 2021-08-24 ENCOUNTER — Other Ambulatory Visit: Payer: Self-pay

## 2021-08-24 ENCOUNTER — Ambulatory Visit
Admission: RE | Admit: 2021-08-24 | Discharge: 2021-08-24 | Disposition: A | Discharge status: Home / Self Care | Payer: BC Managed Care – PPO | Source: Ambulatory Visit | Attending: Sports Medicine | Admitting: Sports Medicine

## 2021-08-24 DIAGNOSIS — M47816 Spondylosis without myelopathy or radiculopathy, lumbar region: Secondary | ICD-10-CM

## 2021-08-24 IMAGING — XA Imaging study
2 series · 2 of 2 positions shown · non-contrast
Comparison: none

CLINICAL DATA: Lumbosacral spondylosis without myelopathy. Chronic
central low back pain. No prior injections or surgery.

[Series 1: ortho standard · 1 of 1 slices shown (1 of 2)]
[im 1/1]
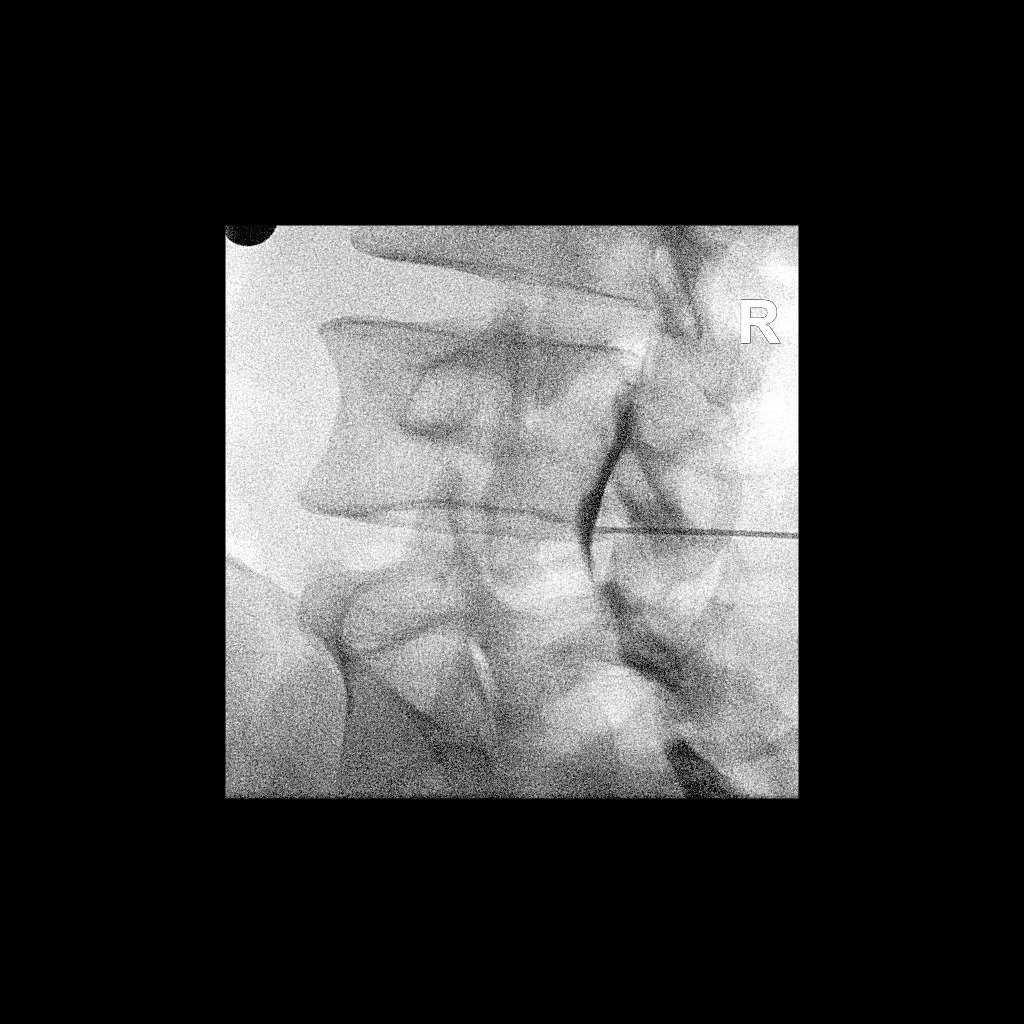

[Series 2: ortho standard · 1 of 1 slices shown (2 of 2)]
[im 1/1]
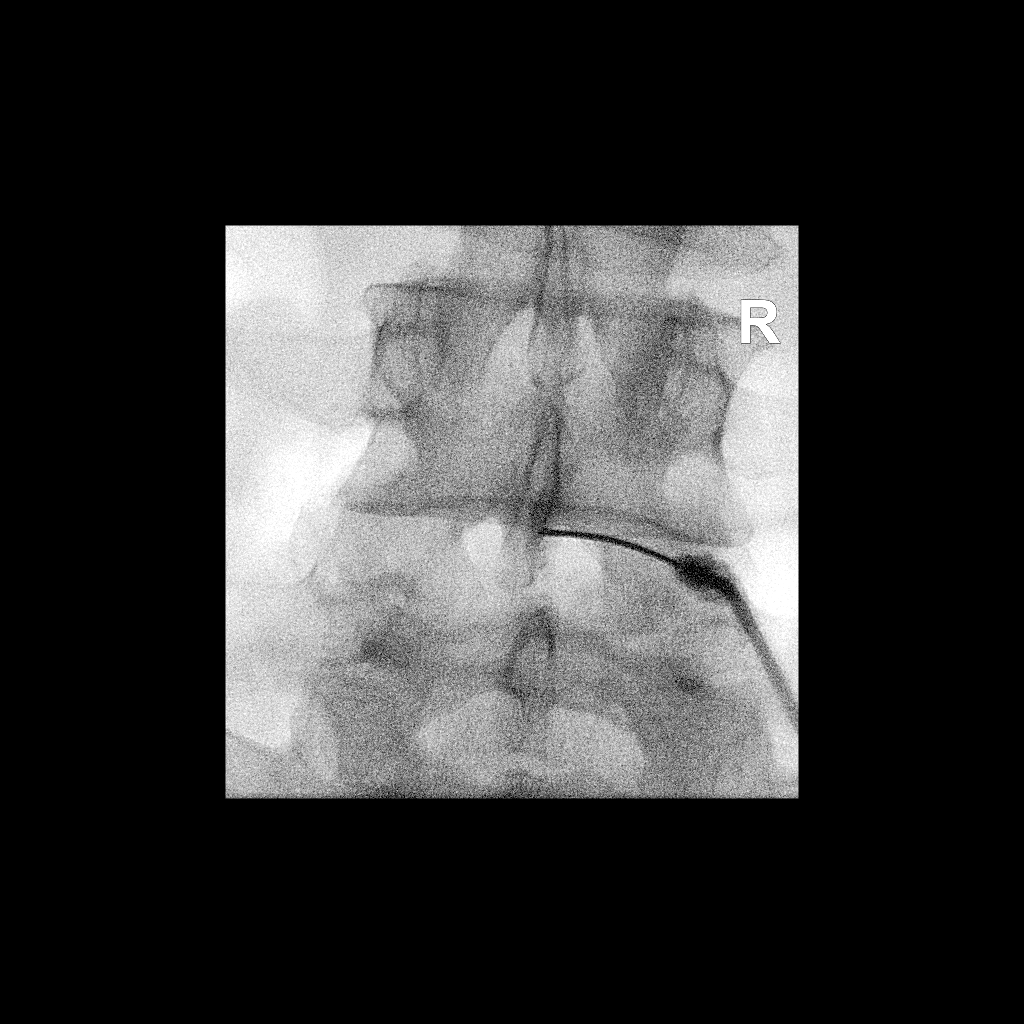

[2 of 2 positions shown; findings below may reference images not displayed]

FLUOROSCOPY TIME:  Radiation Exposure Index (as provided by the
fluoroscopic device): 1.2 mGy

Fluoroscopy Time:  21 seconds

Number of Acquired Images:  0

PROCEDURE:
The procedure, risks, benefits, and alternatives were explained to
the patient. Questions regarding the procedure were encouraged and
answered. The patient understands and consents to the procedure.

LUMBAR EPIDURAL INJECTION:

An interlaminar approach was performed on the right at L4-L5. The
overlying skin was cleansed and anesthetized. A 3.5 inch 20 gauge
epidural needle was advanced using loss-of-resistance technique.

DIAGNOSTIC EPIDURAL INJECTION:

Injection of Isovue-M 200 shows a good epidural pattern with spread
above and below the level of needle placement, primarily in the
midline. No vascular opacification is seen.

THERAPEUTIC EPIDURAL INJECTION:

80 mg of Depo-Medrol mixed with 3 mL of 1% lidocaine were instilled.
The procedure was well-tolerated, and the patient was discharged
thirty minutes following the injection in good condition.

COMPLICATIONS:
None immediate.
IMPRESSION: Technically successful interlaminar epidural injection on the right
at L4-L5.

## 2021-08-24 MED ORDER — METHYLPREDNISOLONE ACETATE 40 MG/ML INJ SUSP (RADIOLOG
80.0000 mg | Freq: Once | INTRAMUSCULAR | Status: AC
  Administered 2021-08-24: 80 mg via EPIDURAL

## 2021-08-24 MED ORDER — IOPAMIDOL (ISOVUE-M 200) INJECTION 41%
1.0000 mL | Freq: Once | INTRAMUSCULAR | Status: AC
  Administered 2021-08-24: 1 mL via EPIDURAL

## 2021-08-24 NOTE — Discharge Instructions (Signed)

## 2021-09-14 ENCOUNTER — Ambulatory Visit
Admission: RE | Admit: 2021-09-14 | Discharge: 2021-09-14 | Disposition: A | Discharge status: Home / Self Care | Payer: BC Managed Care – PPO | Source: Ambulatory Visit | Attending: Sports Medicine | Admitting: Sports Medicine

## 2021-09-14 DIAGNOSIS — M5030 Other cervical disc degeneration, unspecified cervical region: Secondary | ICD-10-CM

## 2021-09-14 IMAGING — XA DG INJECT/[PERSON_NAME] INC NEEDLE/CATH/PLC EPI/CERV/THOR W/IMG
2 series · 2 of 2 positions shown · non-contrast
Comparison: none

CLINICAL DATA: Cervical spondylosis without myelopathy. Excellent
Recurrent pain and numbness in the neck and left greater than right
arms.

[Series 1: ortho adipose · 1 of 1 slices shown (1 of 2)]
[im 1/1]
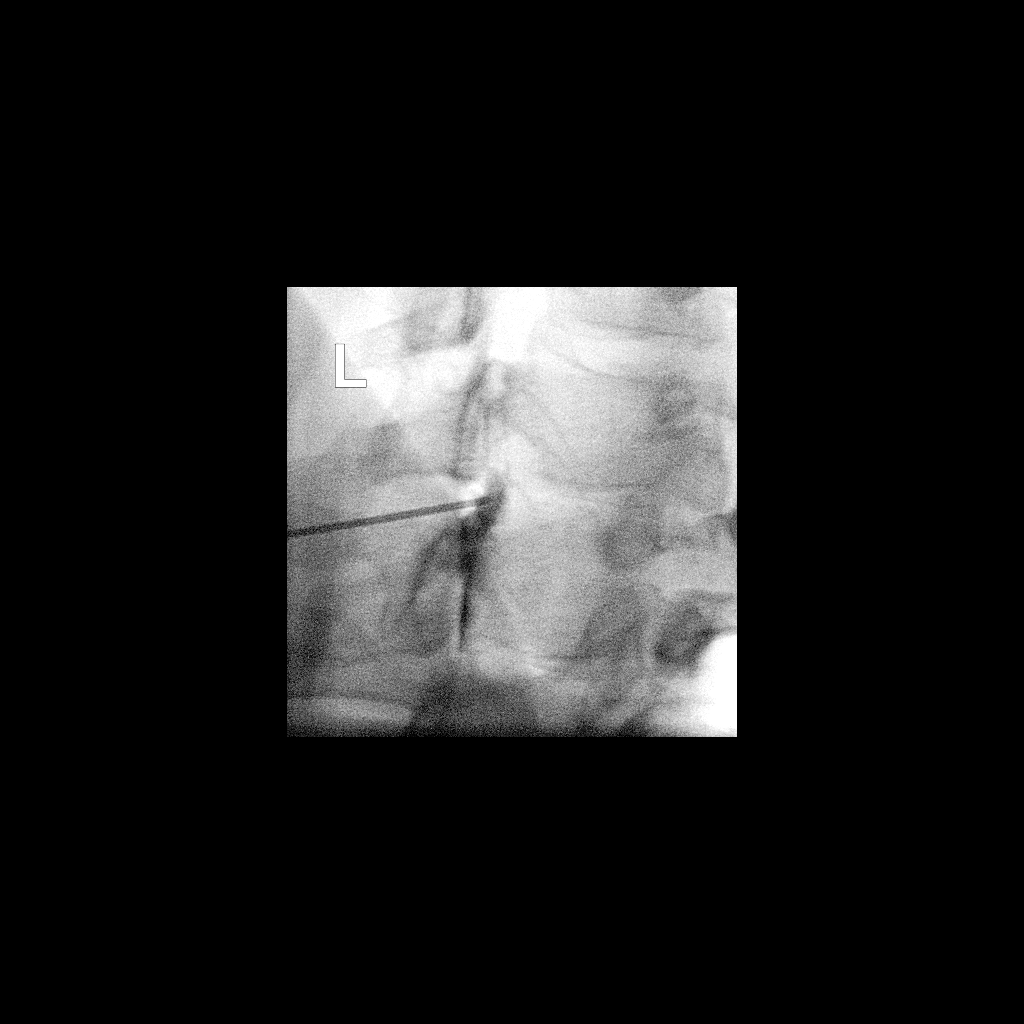

[Series 2: ortho adipose · 1 of 1 slices shown (2 of 2)]
[im 1/1]
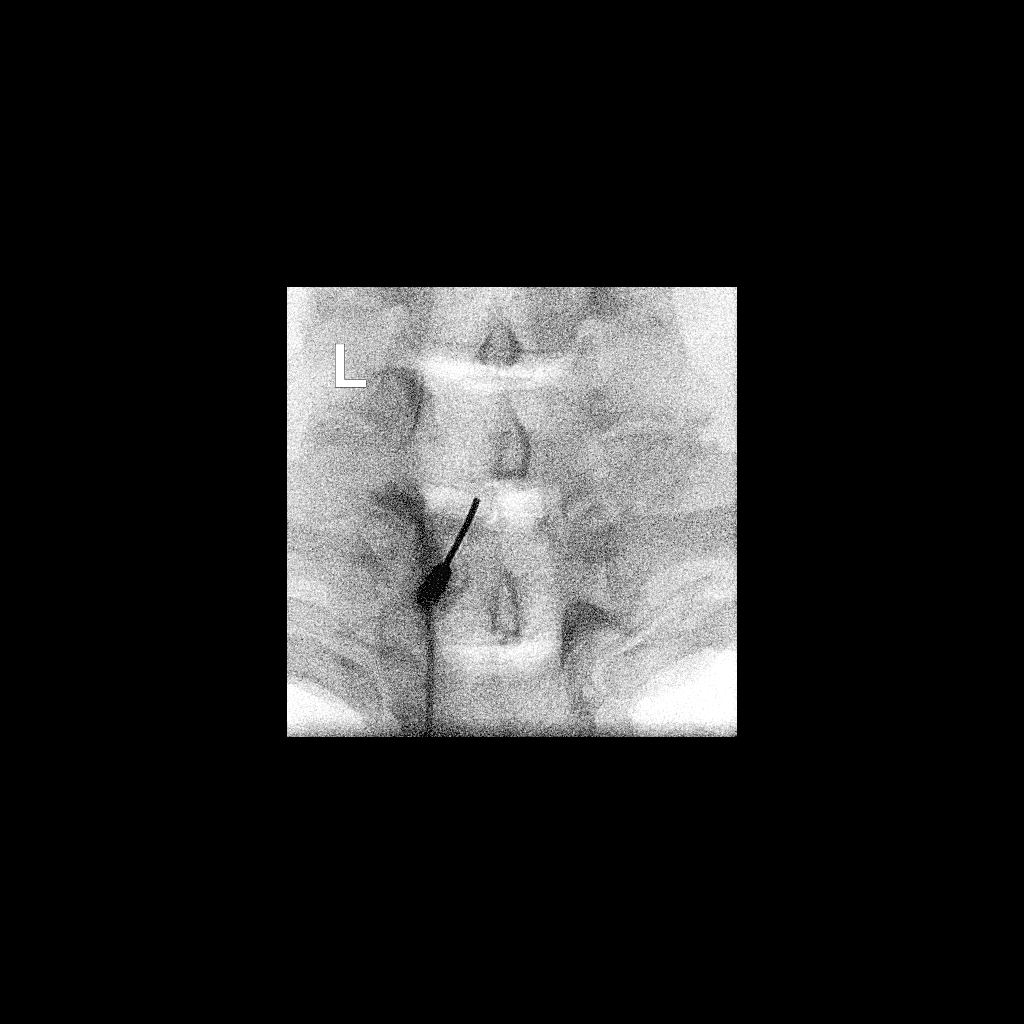

[2 of 2 positions shown; findings below may reference images not displayed]

FLUOROSCOPY TIME:  Fluoroscopy Time: 17 seconds

Radiation Exposure Index: 2.62 microGray*m^2

PROCEDURE:
The procedure, risks, benefits, and alternatives were explained to
the patient. Questions regarding the procedure were encouraged and
answered. The patient understands and consents to the procedure.

CERVICAL EPIDURAL INJECTION

An interlaminar approach was performed on the left at C7-T1. A
inch 20 gauge epidural needle was advanced using loss-of-resistance
technique.

DIAGNOSTIC EPIDURAL INJECTION

Injection of Isovue-M 300 shows a good epidural pattern with spread
above and below the level of needle placement, primarily on the
left. No vascular opacification is seen.

THERAPEUTIC EPIDURAL INJECTION

1.5 ml of Kenalog 40 mixed with 2 ml of normal saline were then
instilled. The procedure was well-tolerated, and the patient was
discharged thirty minutes following the injection in good condition.
IMPRESSION: Technically successful interlaminar epidural injection on the left
at C7-T1.

## 2021-09-14 MED ORDER — IOPAMIDOL (ISOVUE-M 300) INJECTION 61%
1.0000 mL | Freq: Once | INTRAMUSCULAR | Status: AC
  Administered 2021-09-14: 1 mL via EPIDURAL

## 2021-09-14 MED ORDER — TRIAMCINOLONE ACETONIDE 40 MG/ML IJ SUSP (RADIOLOGY)
60.0000 mg | Freq: Once | INTRAMUSCULAR | Status: AC
  Administered 2021-09-14: 60 mg via EPIDURAL

## 2021-09-14 NOTE — Discharge Instructions (Signed)

## 2021-10-01 ENCOUNTER — Other Ambulatory Visit: Payer: Self-pay | Admitting: Sports Medicine

## 2021-10-01 DIAGNOSIS — M47816 Spondylosis without myelopathy or radiculopathy, lumbar region: Secondary | ICD-10-CM

## 2021-10-03 ENCOUNTER — Other Ambulatory Visit: Payer: Self-pay

## 2021-10-03 ENCOUNTER — Telehealth (INDEPENDENT_AMBULATORY_CARE_PROVIDER_SITE_OTHER): Payer: BC Managed Care – PPO | Admitting: Sports Medicine

## 2021-10-03 DIAGNOSIS — M5030 Other cervical disc degeneration, unspecified cervical region: Secondary | ICD-10-CM | POA: Diagnosis not present

## 2021-10-03 DIAGNOSIS — M47816 Spondylosis without myelopathy or radiculopathy, lumbar region: Secondary | ICD-10-CM

## 2021-10-03 MED ORDER — PREGABALIN 50 MG PO CAPS: 50.0000 mg | ORAL_CAPSULE | Freq: Two times a day (BID) | ORAL | 3 refills | Status: DC

## 2021-10-03 NOTE — Assessment & Plan Note (Signed)
Kathryn Mosley is a pleasant 51 year old female, she has moderate cervical DDD with some retrolisthesis C5 on C6, she had a good response with cervical epidural in the summertime of last year, repeated at, unfortunately she did not have a good response this time, continues to have axial neck pain with burning sensations. No red flag symptoms, continue Celebrex, adding Lyrica low-dose with an up titration, we can touch base again in a telephone visit to reevaluate.

## 2021-10-03 NOTE — Progress Notes (Signed)
° °  Virtual Visit via Telephone   I connected with  Kathryn Mosley  on 10/03/21 by telephone/telehealth and verified that I am speaking with the correct person using two identifiers.   I discussed the limitations, risks, security and privacy concerns of performing an evaluation and management service by telephone, including the higher likelihood of inaccurate diagnosis and treatment, and the availability of in person appointments.  We also discussed the likely need of an additional face to face encounter for complete and high quality delivery of care.  I also discussed with the patient that there may be a patient responsible charge related to this service. The patient expressed understanding and wishes to proceed.  Provider location is in medical facility. Patient location is at their home, different from provider location. People involved in care of the patient during this telehealth encounter were myself, my nurse/medical assistant, and my front office/scheduling team member.  Review of Systems: No fevers, chills, night sweats, weight loss, chest pain, or shortness of breath.   Objective Findings:    General: Speaking full sentences, no audible heavy breathing.  Sounds alert and appropriately interactive.    Independent interpretation of tests performed by another provider:   None.  Brief History, Exam, Impression, and Recommendations:    DDD (degenerative disc disease), cervical Siearra is a pleasant 51 year old female, she has moderate cervical DDD with some retrolisthesis C5 on C6, she had a good response with cervical epidural in the summertime of last year, repeated at, unfortunately she did not have a good response this time, continues to have axial neck pain with burning sensations. No red flag symptoms, continue Celebrex, adding Lyrica low-dose with an up titration, we can touch base again in a telephone visit to reevaluate.  Lumbar spondylosis As above with her cervical spine we also  proceeded with a lumbar epidural for lumbar DDD, she had predominantly axial discogenic back pain, she has not noted much improvement, Lyrica should help, we will continue the Lyrica up titration for now.   I discussed the above assessment and treatment plan with the patient. The patient was provided an opportunity to ask questions and all were answered. The patient agreed with the plan and demonstrated an understanding of the instructions.   The patient was advised to call back or seek an in-person evaluation if the symptoms worsen or if the condition fails to improve as anticipated.   I provided 30 minutes of verbal and non-verbal time during this encounter date, time was needed to gather information, review chart, records, communicate/coordinate with staff remotely, as well as complete documentation.   ___________________________________________ Ihor Austin. Benjamin Stain, M.D., ABFM., CAQSM. Primary Care and Sports Medicine [facility]  Adjunct Professor of Family Medicine  [employer]

## 2021-10-03 NOTE — Assessment & Plan Note (Signed)
As above with her cervical spine we also proceeded with a lumbar epidural for lumbar DDD, she had predominantly axial discogenic back pain, she has not noted much improvement, Lyrica should help, we will continue the Lyrica up titration for now.

## 2021-10-03 NOTE — Progress Notes (Signed)
Cervical and lumber at [facility] imaging in December. They seemed to help for a few days. Headaches and blurred vision are of concern.

## 2021-10-25 ENCOUNTER — Encounter: Payer: Self-pay | Admitting: Sports Medicine

## 2021-10-25 DIAGNOSIS — M5030 Other cervical disc degeneration, unspecified cervical region: Secondary | ICD-10-CM

## 2021-10-26 MED ORDER — PREGABALIN 75 MG PO CAPS: 75.0000 mg | ORAL_CAPSULE | Freq: Two times a day (BID) | ORAL | 3 refills | Status: DC

## 2021-12-05 ENCOUNTER — Ambulatory Visit: Payer: BC Managed Care – PPO | Admitting: Sports Medicine

## 2021-12-05 ENCOUNTER — Other Ambulatory Visit: Payer: Self-pay

## 2021-12-05 ENCOUNTER — Ambulatory Visit (HOSPITAL_COMMUNITY)
Admission: RE | Admit: 2021-12-05 | Discharge: 2021-12-05 | Disposition: A | Discharge status: Home / Self Care | Payer: BC Managed Care – PPO | Source: Ambulatory Visit | Attending: Sports Medicine | Admitting: Sports Medicine

## 2021-12-05 DIAGNOSIS — M47816 Spondylosis without myelopathy or radiculopathy, lumbar region: Secondary | ICD-10-CM | POA: Diagnosis present

## 2021-12-05 IMAGING — MR MR LUMBAR SPINE W/O CM
4 of 5 series · 30 of 48 positions shown · non-contrast
Comparison: [DATE]

CLINICAL DATA: Low back pain with groin numbness and incontinence

EXAM:
MRI LUMBAR SPINE WITHOUT CONTRAST
TECHNIQUE: Multiplanar, multisequence MR imaging of the lumbar spine was
performed. No intravenous contrast was administered.

[Series 5: T1 · sagittal · 4.0mm · 0.81mm/px · 4 of 14 slices shown (1 of 2)]
[im 1/14]
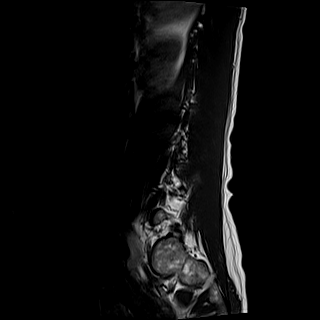
[im 5/14]
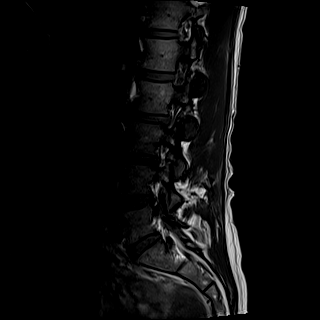
[im 9/14]
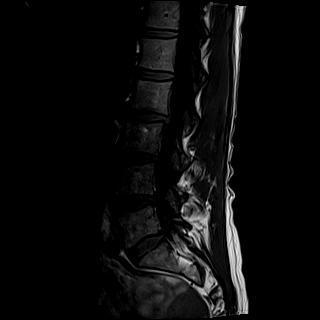
[im 14/14]
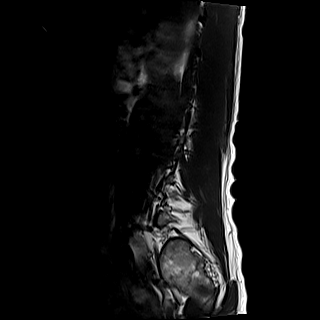

[Series 6: T2 · sagittal · 4.0mm · 0.81mm/px · 5 of 14 slices shown (1 of 2)]
[im 1/14]
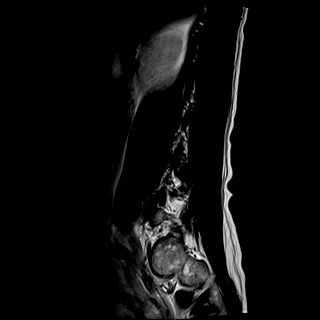
[im 4/14]
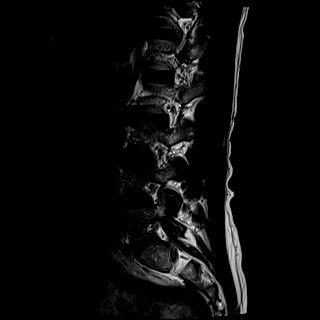
[im 7/14]
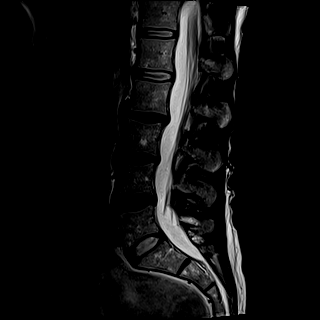
[im 10/14]
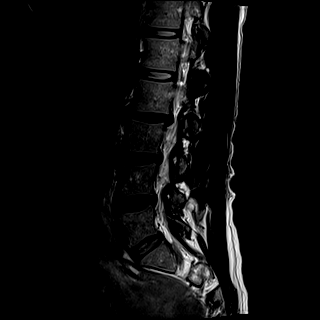
[im 14/14]
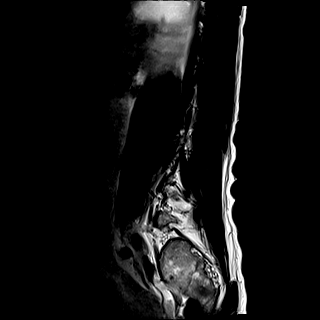

[Series 8: T2 · axial · 4.0mm · 0.62mm/px · z∈[-106,+128]mm · 11 of 47 slices shown (2 of 2)]
[im 3/47]
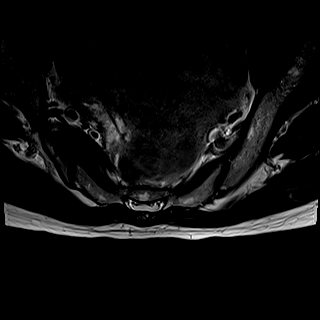
[im 6/47]
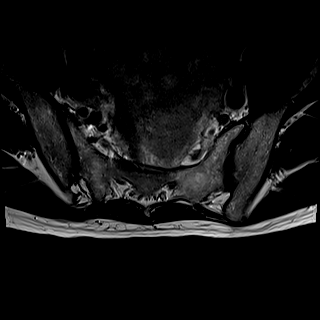
[im 9/47]
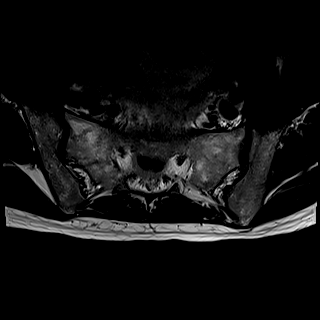
[im 15/47]
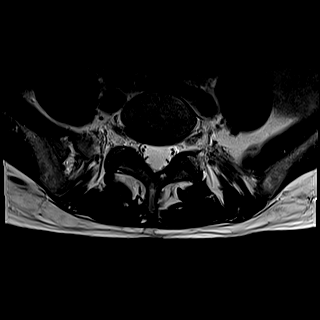
[im 21/47]
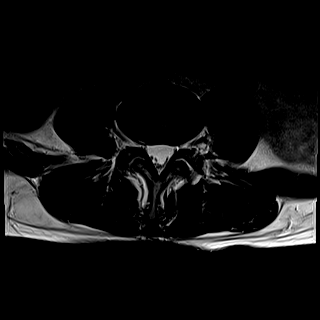
[im 24/47]
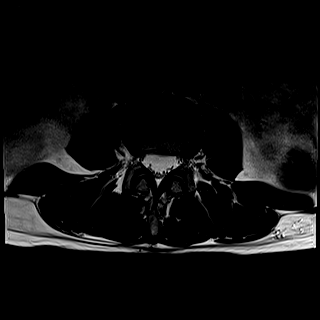
[im 26/47]
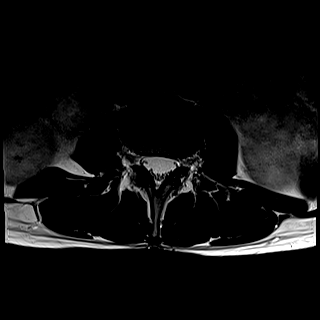
[im 32/47]
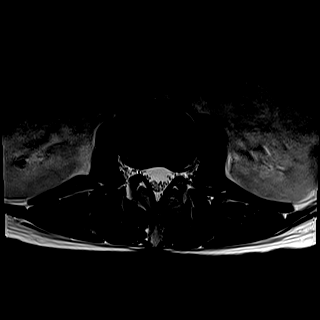
[im 38/47]
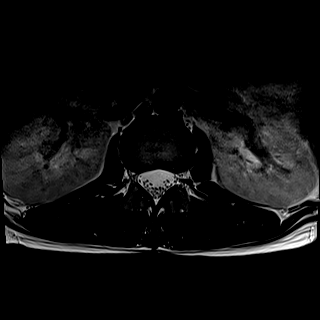
[im 41/47]
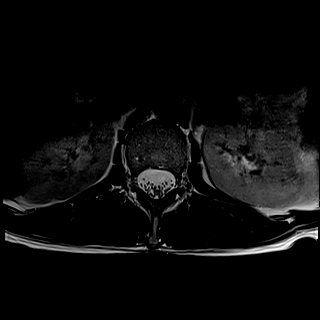
[im 44/47]
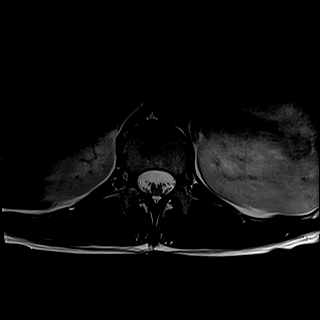

[Series 9: T1 · axial · 4.0mm · 0.39mm/px · z∈[-106,+113]mm · 10 of 47 slices shown (2 of 2)]
[im 3/47]
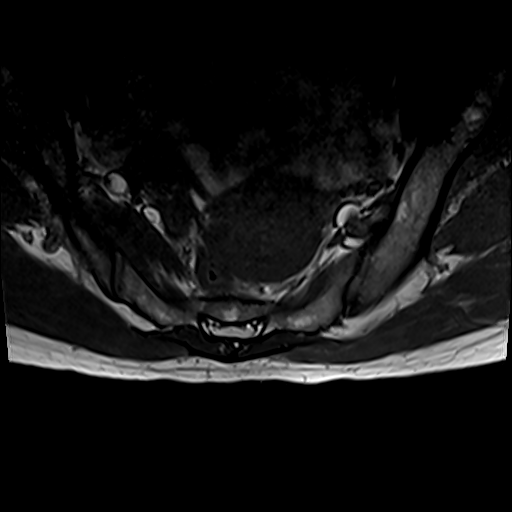
[im 6/47]
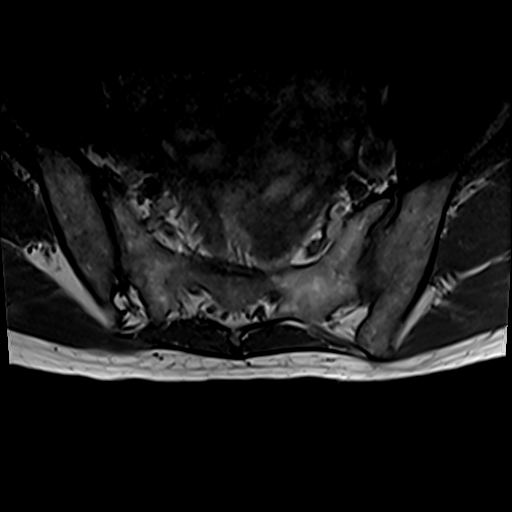
[im 9/47]
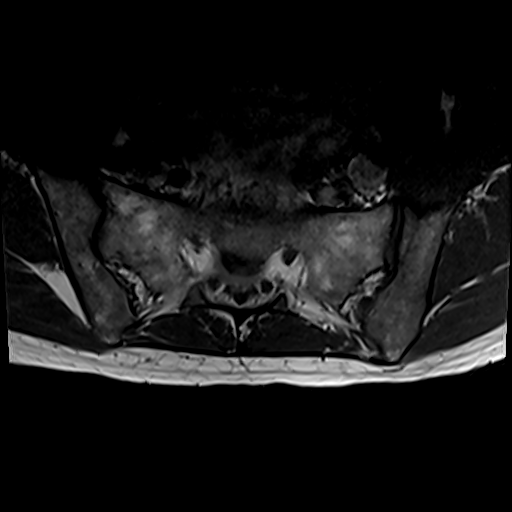
[im 15/47]
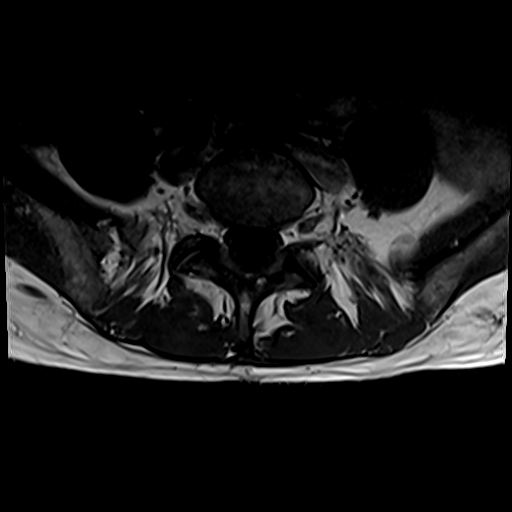
[im 21/47]
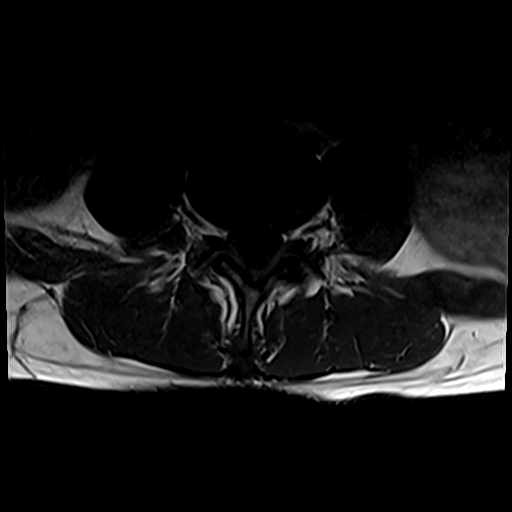
[im 24/47]
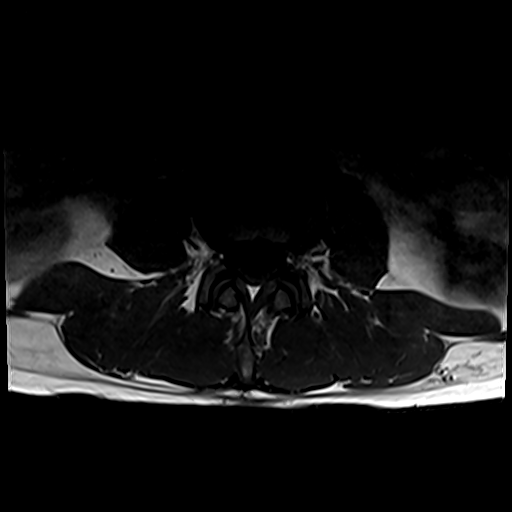
[im 26/47]
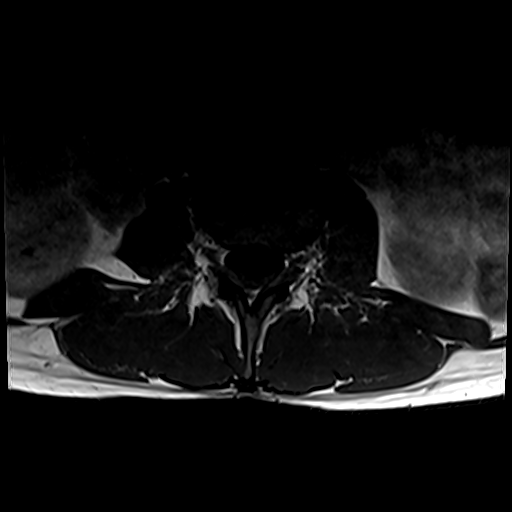
[im 32/47]
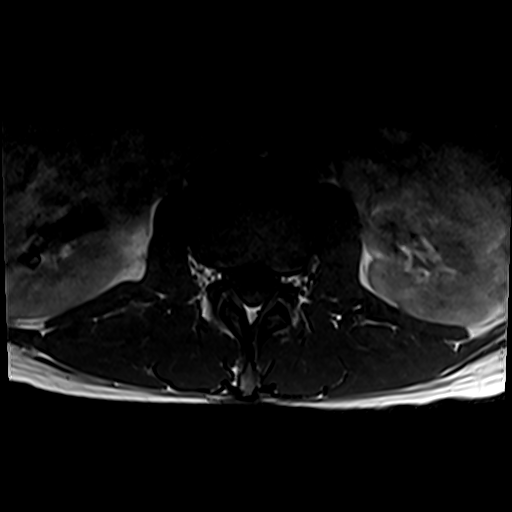
[im 38/47]
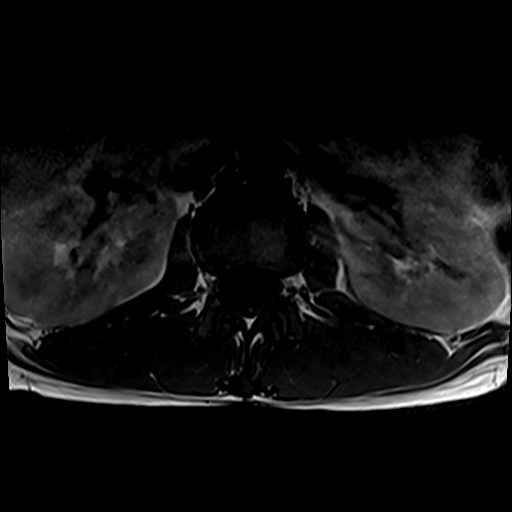
[im 41/47]
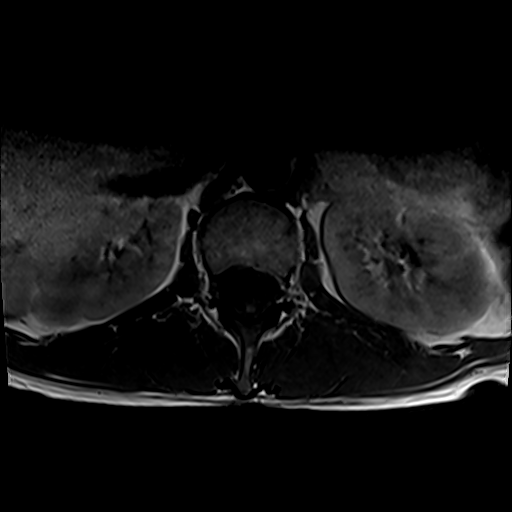

[30 of 48 positions shown; findings below may reference images not displayed]

FINDINGS: Segmentation:  Standard.

Alignment:  Grade 1 retrolisthesis at L2-3 and L3-4

Vertebrae:  No fracture, evidence of discitis, or bone lesion.

Conus medullaris and cauda equina: Conus extends to the L1 level.
Conus and cauda equina appear normal.

Paraspinal and other soft tissues: Negative

Disc levels:

L1-L2: Normal disc space and facet joints. No spinal canal stenosis.
No neural foraminal stenosis.

L2-L3: Small disc bulge. No spinal canal stenosis. No neural
foraminal stenosis.

L3-L4: Small disc bulge. Left foraminal annular fissure is close to
the exiting L3 nerve root. No spinal canal stenosis. No neural
foraminal stenosis.

L4-L5: Small right asymmetric disc bulge with annular fissure in
close proximity to the exiting right L4 nerve root. No spinal canal
stenosis. No neural foraminal stenosis.

L5-S1: Normal disc space and facet joints. No spinal canal stenosis.
No neural foraminal stenosis.

Visualized sacrum: Normal.
IMPRESSION: 1. No cauda equina compression
2. Unchanged small right asymmetric disc bulge with annular fissure
at L4-L5 in close proximity to the exiting right L4 nerve root.
3. Unchanged L3-4 left foraminal annular fissure is in close
proximity to the exiting L3 nerve root.

## 2021-12-05 MED ORDER — PREDNISONE 50 MG PO TABS: ORAL_TABLET | ORAL | 0 refills | Status: DC

## 2021-12-05 NOTE — Assessment & Plan Note (Signed)
This is a pleasant 51 year old female, she has known lumbar DDD with mostly disc desiccation, small protrusions, historically without significant central or foraminal stenosis. ?She was doing well with lumbar epidurals and Lyrica. ?Her bedroom relationship and function was also normal on the Lyrica prior. ?A month ago she took a deep breath and felt a pop in the right side of her low back followed by severe pain. ?Subsequently she started to develop numbness in her vulva, possibly in her perineum, and an abnormal sensation when trying to void and stool. ?She denies any progressive weakness, no fevers. ?On exam she does have good strength in the lower extremities, considering groin numbness as well as urinary dysfunction combined with acute back pain I do think we need to look for an annular tear and signs of cauda equina syndrome. ?Adding 5 days of prednisone and a stat lumbar spine MRI. ?

## 2021-12-05 NOTE — Progress Notes (Signed)
? ? ?  Procedures performed today:   ? ?None. ? ?Independent interpretation of notes and tests performed by another provider:  ? ?None. ? ?Brief History, Exam, Impression, and Recommendations:   ? ?Lumbar spondylosis ?This is a pleasant 51 year old female, she has known lumbar DDD with mostly disc desiccation, small protrusions, historically without significant central or foraminal stenosis. ?She was doing well with lumbar epidurals and Lyrica. ?Her bedroom relationship and function was also normal on the Lyrica prior. ?A month ago she took a deep breath and felt a pop in the right side of her low back followed by severe pain. ?Subsequently she started to develop numbness in her vulva, possibly in her perineum, and an abnormal sensation when trying to void and stool. ?She denies any progressive weakness, no fevers. ?On exam she does have good strength in the lower extremities, considering groin numbness as well as urinary dysfunction combined with acute back pain I do think we need to look for an annular tear and signs of cauda equina syndrome. ?Adding 5 days of prednisone and a stat lumbar spine MRI. ? ? ? ?___________________________________________ ?Gwen Her. Dianah Field, M.D., ABFM., CAQSM. ?Primary Care and Sports Medicine ?[place] ? ?Adjunct Instructor of Family Medicine  ?[employer] ?

## 2022-03-28 ENCOUNTER — Encounter: Payer: Self-pay | Admitting: Sports Medicine

## 2022-04-01 ENCOUNTER — Other Ambulatory Visit: Payer: Self-pay | Admitting: Sports Medicine

## 2022-05-28 ENCOUNTER — Ambulatory Visit: Payer: BC Managed Care – PPO | Admitting: Sports Medicine

## 2022-05-28 DIAGNOSIS — M5030 Other cervical disc degeneration, unspecified cervical region: Secondary | ICD-10-CM

## 2022-05-28 NOTE — Assessment & Plan Note (Signed)
Kathryn Mosley returns, she is a pleasant 51 year old female, moderate cervical DDD with some retrolisthesis C5 on C6, she did have an epidural in 2022, x2, did have some improvement. Ultimately Celebrex and Lyrica controlled her pain well. Unfortunately she did develop some twitching, tingling, gait disturbances, weakness, numbness in the legs, profound fatigue. She also developed some cognitive disturbances. She had an extensive work-up with her PCP, negative labs, as well as a negative brain MRI with and without contrast, and she did see a neurologist. She is off of her Lyrica completely as we were concerned that the Lyrica was causing her symptoms but unfortunately her neurologic symptoms have persisted, her orthopedic pain has however worsened. She does ask today about if there is a possibility of getting disability. The constellation of symptoms is somewhat puzzling, I did suggest that we could restart her Lyrica, she declined gabapentin. She will think about this for now, I do however think she needs an additional work-up for motor neuron disease with her neurologist in the form of bilateral upper and lower extremity nerve conduction and EMG, we will do a referral back to [facility] neurology. I also prepared her for the possibility that all of the resultant testing could potentially come back negative raising the likelihood of myofascial pain syndrome/fibromyalgia/chronic fatigue syndrome. She does endorse that her mood is good but she is frustrated. I do not think I have much to offer from an orthopedic/sports medicine standpoint I think she should return to her neurologist for nerve conduction/EMG.

## 2022-05-28 NOTE — Progress Notes (Signed)
    Procedures performed today:    None.  Independent interpretation of notes and tests performed by another provider:   None.  Brief History, Exam, Impression, and Recommendations:    DDD (degenerative disc disease), cervical Kathryn Mosley returns, she is a pleasant 51 year old female, moderate cervical DDD with some retrolisthesis C5 on C6, she did have an epidural in 2022, x2, did have some improvement. Ultimately Celebrex and Lyrica controlled her pain well. Unfortunately she did develop some twitching, tingling, gait disturbances, weakness, numbness in the legs, profound fatigue. She also developed some cognitive disturbances. She had an extensive work-up with her PCP, negative labs, as well as a negative brain MRI with and without contrast, and she did see a neurologist. She is off of her Lyrica completely as we were concerned that the Lyrica was causing her symptoms but unfortunately her neurologic symptoms have persisted, her orthopedic pain has however worsened. She does ask today about if there is a possibility of getting disability. The constellation of symptoms is somewhat puzzling, I did suggest that we could restart her Lyrica, she declined gabapentin. She will think about this for now, I do however think she needs an additional work-up for motor neuron disease with her neurologist in the form of bilateral upper and lower extremity nerve conduction and EMG, we will do a referral back to [facility] neurology. I also prepared her for the possibility that all of the resultant testing could potentially come back negative raising the likelihood of myofascial pain syndrome/fibromyalgia/chronic fatigue syndrome. She does endorse that her mood is good but she is frustrated. I do not think I have much to offer from an orthopedic/sports medicine standpoint I think she should return to her neurologist for nerve conduction/EMG.  I spent 30 minutes of total time managing this patient today, this  includes chart review, face to face, and non-face to face time.  ____________________________________________ Kathryn Mosley. Kathryn Mosley, M.D., ABFM., CAQSM., AME. Primary Care and Sports Medicine Kathryn Mosley  Adjunct Professor of Family Medicine  [employer] [employer]  Restaurant manager, fast food

## 2022-07-30 ENCOUNTER — Other Ambulatory Visit: Payer: Self-pay | Admitting: Sports Medicine

## 2022-10-10 DIAGNOSIS — G5603 Carpal tunnel syndrome, bilateral upper limbs: Secondary | ICD-10-CM | POA: Insufficient documentation

## 2022-11-23 ENCOUNTER — Other Ambulatory Visit: Payer: Self-pay | Admitting: Sports Medicine

## 2023-10-20 ENCOUNTER — Ambulatory Visit: Payer: 59 | Admitting: Family Medicine

## 2023-10-23 ENCOUNTER — Encounter: Payer: Self-pay | Admitting: Family Medicine

## 2023-10-23 ENCOUNTER — Ambulatory Visit: Payer: 59 | Admitting: Family Medicine

## 2023-10-23 VITALS — BP 105/71 | HR 85 | Temp 98.4°F | Resp 18 | Ht 65.0 in | Wt 127.3 lb

## 2023-10-23 DIAGNOSIS — R5382 Chronic fatigue, unspecified: Secondary | ICD-10-CM

## 2023-10-23 DIAGNOSIS — Z8619 Personal history of other infectious and parasitic diseases: Secondary | ICD-10-CM

## 2023-10-23 DIAGNOSIS — Z7689 Persons encountering health services in other specified circumstances: Secondary | ICD-10-CM | POA: Diagnosis not present

## 2023-10-23 NOTE — Progress Notes (Signed)
 New Patient Office Visit  Subjective    Patient ID: Kathryn Mosley, female    DOB: July 09, 1971  Age: 52 y.o. MRN: [id number]  CC:  Chief Complaint  Patient presents with   Establish Care    Patient is here to establish care with a new PCP, She states that she would like to discuss a chronic issue she has dealt with for 2 years, she had Covid, Lyme disease, and pneumonia. Along with pain in left side and lower back pain     HPI Kathryn Mosley presents to establish care. Pt is new to me.  Pt reports several years of episodes of not being able to control her legs and unable to walk. She also reports decline in vision with hx of TIAs in the last few years. She was seen by Neurology and was diagnosed with Lyme, mycoplasma, and Covid last year. She was on antibiotics for 9 months and she reports her symptoms improved. She reports her fatigue and issues with walking improved but she says her fatigue has returned. She reports she had a mystery illness in her 18s and wonder if this was the cause.  She is seeing Endocrinology now for suspicion of insulinoma. She will be returning to follow up with them. She does report she goes to bed at 8pm and is up for the day at 2am. She does nap 15 mins in a day.   Outpatient Encounter Medications as of 10/23/2023  Medication Sig   amphetamine-dextroamphetamine (ADDERALL XR) 10 MG 24 hr capsule TAKE ONE CAPSULE BY MOUTH ONE TIME DAILY FOR 30 DAYS. MAX DAILY AMOUNT 10MG    estradiol (ESTRACE) 0.5 MG tablet Take by mouth.   levothyroxine (SYNTHROID) 50 MCG tablet Take 1 tablet by mouth daily.   liothyronine (CYTOMEL) 5 MCG tablet Take 5 mcg by mouth daily.   celecoxib  (CELEBREX ) 200 MG capsule TAKE 1 OR 2 CAPSULES BY MOUTH DAILY AS NEEDED FOR PAIN (Patient not taking: Reported on 10/23/2023)   [DISCONTINUED] thyroid (ARMOUR) 30 MG tablet Take by mouth.   No facility-administered encounter medications on file as of 10/23/2023.    Past Medical History:  Diagnosis Date    Allergy 11/1972   Gluten   Anemia    Arthritis    GERD (gastroesophageal reflux disease)     Past Surgical History:  Procedure Laterality Date   CESAREAN SECTION  1994   COSMETIC SURGERY  1989   Rhinoplasty/Liposuction    Family History  Problem Relation Age of Onset   Arthritis Mother    Cancer Mother    Heart disease Mother    Cancer Father    Heart disease Father     Social History   Socioeconomic History   Marital status: Married    Spouse name: Not on file   Number of children: 1   Years of education: Not on file   Highest education level: Bachelor's degree (e.g., BA, AB, BS)  Occupational History   Not on file  Tobacco Use   Smoking status: Former    Current packs/day: 0.00    Average packs/day: 3.0 packs/day for 20.0 years (60.0 ttl pk-yrs)    Types: Cigarettes    Quit date: 09/16/2008    Years since quitting: 15.1    Passive exposure: Past   Smokeless tobacco: Never  Vaping Use   Vaping status: Every Day  Substance and Sexual Activity   Alcohol use: Yes    Alcohol/week: 2.0 standard drinks of alcohol    Types: 2  Glasses of wine per week    Comment: To relieve constipation   Drug use: Never   Sexual activity: Yes    Birth control/protection: None  Other Topics Concern   Not on file  Social History Narrative   Not on file   Social Drivers of Health   Financial Resource Strain: Low Risk  (10/19/2023)   Overall Financial Resource Strain (CARDIA)    Difficulty of Paying Living Expenses: Not hard at all  Food Insecurity: No Food Insecurity (10/19/2023)   Hunger Vital Sign    Worried About Running Out of Food in the Last Year: Never true    Ran Out of Food in the Last Year: Never true  Transportation Needs: No Transportation Needs (10/19/2023)   PRAPARE - Administrator, Civil Service (Medical): No    Lack of Transportation (Non-Medical): No  Physical Activity: Sufficiently Active (10/19/2023)   Exercise Vital Sign    Days of Exercise per  Week: 7 days    Minutes of Exercise per Session: 30 min  Stress: No Stress Concern Present (10/19/2023)   [employer] of Occupational Health - Occupational Stress Questionnaire    Feeling of Stress : Not at all  Social Connections: Socially Integrated (10/19/2023)   Social Connection and Isolation Panel [NHANES]    Frequency of Communication with Friends and Family: More than three times a week    Frequency of Social Gatherings with Friends and Family: Once a week    Attends Religious Services: 1 to 4 times per year    Active Member of [employer] or Organizations: Yes    Attends Engineer, Structural: More than 4 times per year    Marital Status: Married  Recent Concern: Social Connections - Somewhat Isolated (10/16/2023)   Received from [employer]   Social Network    How would you rate your social network (family, work, friends)?: Restricted participation with some degree of social isolation  Intimate Partner Violence: Not At Risk (10/16/2023)   Received from [facility]   HITS    Over the last 12 months how often did your partner physically hurt you?: Never    Over the last 12 months how often did your partner insult you or talk down to you?: Never    Over the last 12 months how often did your partner threaten you with physical harm?: Never    Over the last 12 months how often did your partner scream or curse at you?: Never    Review of Systems  Constitutional:  Positive for malaise/fatigue.  All other systems reviewed and are negative.      Objective    BP 105/71   Pulse 85   Temp 98.4 F (36.9 C) (Oral)   Resp 18   Ht 5' 5 (1.651 m)   Wt 127 lb 4.8 oz (57.7 kg)   SpO2 100%   BMI 21.18 kg/m   Physical Exam Vitals and nursing note reviewed.  Constitutional:      Appearance: Normal appearance. She is normal weight.  HENT:     Head: Normocephalic and atraumatic.     Right Ear: External ear normal.     Left Ear: External ear normal.     Nose: Nose normal.      Mouth/Throat:     Mouth: Mucous membranes are moist.     Pharynx: Oropharynx is clear.  Eyes:     Conjunctiva/sclera: Conjunctivae normal.     Pupils: Pupils are equal, round, and reactive  to light.  Cardiovascular:     Rate and Rhythm: Normal rate and regular rhythm.     Pulses: Normal pulses.     Heart sounds: Normal heart sounds.  Pulmonary:     Effort: Pulmonary effort is normal.     Breath sounds: Normal breath sounds.  Abdominal:     General: Abdomen is flat. Bowel sounds are normal.  Skin:    General: Skin is warm.     Capillary Refill: Capillary refill takes less than 2 seconds.  Neurological:     General: No focal deficit present.     Mental Status: She is alert and oriented to person, place, and time. Mental status is at baseline.  Psychiatric:        Mood and Affect: Mood normal.        Behavior: Behavior normal.        Thought Content: Thought content normal.        Judgment: Judgment normal.      Assessment & Plan:   Problem List Items Addressed This Visit   None Encounter to establish care with new doctor  Chronic fatigue -     Ambulatory referral to Sleep Studies  History of Lyme disease   Due to chronic fatigue, will proceed with sleep study.  To follow up after the sleep study.   No follow-ups on file.   Torrence CINDERELLA Barrier, MD

## 2024-03-28 ENCOUNTER — Other Ambulatory Visit: Payer: Self-pay

## 2024-03-28 ENCOUNTER — Ambulatory Visit: Admission: EM | Admit: 2024-03-28 | Discharge: 2024-03-28 | Disposition: A | Discharge status: Home / Self Care

## 2024-03-28 DIAGNOSIS — H9192 Unspecified hearing loss, left ear: Secondary | ICD-10-CM | POA: Diagnosis not present

## 2024-03-28 DIAGNOSIS — H6123 Impacted cerumen, bilateral: Secondary | ICD-10-CM | POA: Diagnosis not present

## 2024-03-28 DIAGNOSIS — G5603 Carpal tunnel syndrome, bilateral upper limbs: Secondary | ICD-10-CM

## 2024-03-28 DIAGNOSIS — H6993 Unspecified Eustachian tube disorder, bilateral: Secondary | ICD-10-CM | POA: Diagnosis not present

## 2024-03-28 MED ORDER — FLUTICASONE PROPIONATE 50 MCG/ACT NA SUSP: 1.0000 | Freq: Every day | NASAL | 0 refills | Status: AC | PRN

## 2024-03-28 NOTE — Discharge Instructions (Signed)
 Avoid using Qtips in the future. Follow up as needed for lavage. I also sent a prescription for a nasal spray to help with fluid behind the ear drum. Please take as directed.

## 2024-03-28 NOTE — ED Triage Notes (Signed)
 C/O left ear fullness and pressure. Patient states hx of tinnitis and states she can't hear well from the left ear.

## 2024-03-28 NOTE — ED Provider Notes (Signed)
 [facility]  Note:  This document was prepared using Dragon voice recognition software and may include unintentional dictation errors.  MRN: [id number] DOB: 12/06/1970 DATE: 03/28/24   Subjective:  Chief Complaint:  Chief Complaint  Patient presents with   Ear Fullness     HPI: Kathryn Mosley is a 53 y.o. female presenting for left ear fullness and decreased hearing for the past 3 days. Patient states she had a message on Friday and when she got up she noticed that she could not hear out of her left ear. She reports a fullness and pressure sensation on the left side. She also reports some pressure in the right ear as well. Reports history of pruritus in the left ear for years. She has been using Qtips with no relief. Denies fever, nausea/vomiting, cough, sore throat. Endorses ear fullness, decreased hearing, congestion. Presents NAD.  Prior to Admission medications   Medication Sig Start Date End Date Taking? Authorizing Provider  fludrocortisone (FLORINEF) 0.1 MG tablet Take 0.1 mg by mouth daily.   Yes [provider]  Milnacipran (SAVELLA) 50 MG TABS tablet Take 50 mg by mouth 2 (two) times daily.   Yes [provider]  traZODone (DESYREL) 100 MG tablet Take 100 mg by mouth at bedtime.   Yes [provider]  amphetamine-dextroamphetamine (ADDERALL XR) 10 MG 24 hr capsule TAKE ONE CAPSULE BY MOUTH ONE TIME DAILY FOR 30 DAYS. MAX DAILY AMOUNT 10MG  07/28/23   [provider]     Allergies  Allergen Reactions   Fluconazole Swelling    And hives   Pregabalin  Other (See Comments)    Loss of control of legs, blurred vision    History:   Past Medical History:  Diagnosis Date   Allergy 11/1972   Gluten   Anemia    Arthritis    GERD (gastroesophageal reflux disease)      Past Surgical History:  Procedure Laterality Date   CESAREAN SECTION  1994   COSMETIC SURGERY  1989   Rhinoplasty/Liposuction    Family History  Problem Relation Age  of Onset   Arthritis Mother    Cancer Mother    Heart disease Mother    Cancer Father    Heart disease Father     Social History   Tobacco Use   Smoking status: Former    Current packs/day: 0.00    Average packs/day: 3.0 packs/day for 20.0 years (60.0 ttl pk-yrs)    Types: Cigarettes    Quit date: 09/16/2008    Years since quitting: 15.5    Passive exposure: Past   Smokeless tobacco: Never  Vaping Use   Vaping status: Every Day  Substance Use Topics   Alcohol use: Yes    Alcohol/week: 2.0 standard drinks of alcohol    Types: 2 Glasses of wine per week    Comment: To relieve constipation   Drug use: Never    Review of Systems  Constitutional:  Negative for fever.  HENT:  Positive for congestion, hearing loss and tinnitus. Negative for ear discharge, ear pain and sore throat.   Respiratory:  Negative for cough.   Gastrointestinal:  Negative for nausea and vomiting.  Neurological:  Positive for dizziness.     Objective:   Vitals: BP 110/77 (BP Location: Right Arm)   Pulse 79   Temp 98.3 F (36.8 C) (Oral)   Resp 16   LMP 03/25/2024   SpO2 96%   Physical Exam Constitutional:      General: She  is not in acute distress.    Appearance: Normal appearance. She is well-developed and normal weight. She is not ill-appearing or toxic-appearing.  HENT:     Head: Normocephalic and atraumatic.     Right Ear: There is impacted cerumen.     Left Ear: There is impacted cerumen.     Mouth/Throat:     Comments: Left EAC clear after lavage. Slight effusion noted on exam.  Right EAC with small amount of cerumen after lavage. Cardiovascular:     Rate and Rhythm: Normal rate and regular rhythm.     Heart sounds: Normal heart sounds.  Pulmonary:     Effort: Pulmonary effort is normal.     Breath sounds: Normal breath sounds.     Comments: Clear to auscultation bilaterally  Abdominal:     General: Bowel sounds are normal.     Palpations: Abdomen is soft.     Tenderness:  There is no abdominal tenderness.  Skin:    General: Skin is warm and dry.  Neurological:     General: No focal deficit present.     Mental Status: She is alert.  Psychiatric:        Mood and Affect: Mood and affect normal.     Results:  Labs: No results found for this or any previous visit (from the past 24 hours).  Radiology: No results found.   UC Course/Treatments:  Procedures: Ear Cerumen Removal  Date/Time: 03/28/2024 9:17 AM  Performed by: Basilia Ulanda SQUIBB, PA-C Authorized by: Basilia Ulanda SQUIBB, PA-C   Consent:    Consent obtained:  Verbal   Consent given by:  Patient   Risks discussed:  Dizziness and pain   Alternatives discussed:  No treatment, alternative treatment and referral Universal protocol:    Patient identity confirmed:  Verbally with patient Procedure details:    Location: bilateral.   Procedure type: irrigation     Procedure outcomes: cerumen removed   Post-procedure details:    Inspection:  Some cerumen remaining, TM intact and ear canal clear   Hearing quality:  Normal   Procedure completion:  Tolerated well, no immediate complications    Medications Ordered in UC: Medications - No data to display   Assessment and Plan :     ICD-10-CM   1. Bilateral impacted cerumen  H61.23     2. Decreased hearing of left ear  H91.92     3. Dysfunction of both eustachian tubes  H69.93      Bilateral impacted cerumen Decreased hearing of left ear Dysfunction of both eustachian tubes Afebrile, nontoxic-appearing, NAD. VSS. DDX includes but not limited to: otitis media, otitis externa, eustachian tube dysfunction, cerumen impaction Cerumen impaction noted bilaterally. Ear lavage performed using mixture of peroxide and water.  Pressure irrigation performed using a bottle and a thin ear tube. Bilateral ear lavage.  No curette was used. Left EAC clear with noted improvement. Left TM with slight effusion noted no exam. No signs of infection. Right EAC  with small amount of cerumen noted after lavage; however, some irritation of the canal, so will avoid further lavage.  Given small effusion noted on the left side as well as pulsating sensation noted on the right, Flonase  1 spray each nostril daily as needed was prescribed for eustachian tube dysfunction.  Recommend follow-up with ENT if symptoms persist.  Strict ED precautions were given and patient verbalized understanding.   ED Discharge Orders          Ordered  fluticasone  (FLONASE ) 50 MCG/ACT nasal spray  Daily PRN        03/28/24 0916             PDMP not reviewed this encounter.     Chenoah Mcnally, Ulanda SQUIBB, PA-C 03/28/24 0920

## 2024-05-12 ENCOUNTER — Ambulatory Visit: Admitting: Sports Medicine

## 2024-05-18 ENCOUNTER — Ambulatory Visit

## 2024-05-18 ENCOUNTER — Encounter: Payer: Self-pay | Admitting: Sports Medicine

## 2024-05-18 VITALS — BP 102/80 | Ht 65.0 in | Wt 138.0 lb

## 2024-05-18 DIAGNOSIS — M222X2 Patellofemoral disorders, left knee: Secondary | ICD-10-CM | POA: Diagnosis not present

## 2024-05-18 NOTE — Progress Notes (Signed)
   Subjective:    Patient ID: Kathryn Mosley, female    DOB: 53 y.o., October 05, 1970   MRN: [id number]  HPI  Chief Complaint: Left knee pain and clicking  12/05/2021 Lumbar MRI: IMPRESSION: 1. No cauda equina compression 2. Unchanged small right asymmetric disc bulge with annular fissure at L4-L5 in close proximity to the exiting right L4 nerve root. 3. Unchanged L3-4 left foraminal annular fissure is in close proximity to the exiting L3 nerve root.   12/05/2021 visit with Dr. ONEIDA: This is a pleasant 53 year old female, she has known lumbar DDD with mostly disc desiccation, small protrusions, historically without significant central or foraminal stenosis. She was doing well with lumbar epidurals and Lyrica . Her bedroom relationship and function was also normal on the Lyrica  prior. A month ago she took a deep breath and felt a pop in the right side of her low back followed by severe pain. Subsequently she started to develop numbness in her vulva, possibly in her perineum, and an abnormal sensation when trying to void and stool. She denies any progressive weakness, no fevers. On exam she does have good strength in the lower extremities, considering groin numbness as well as urinary dysfunction combined with acute back pain I do think we need to look for an annular tear and signs of cauda equina syndrome. Adding 5 days of prednisone  and a stat lumbar spine MRI.  Today the patient reports that her left knee started clicking about 1 month ago Initially not painful during the clicking Now has become painful Did not swell up No known inciting injury No prior surgery in this knee Has had 2 prior ACL surgeries in her other knee Regular walking causes this to bother her Stairs bother it some as well but not as much No locking/catching   Objective:   Physical Exam Vitals:   05/18/24 1621  BP: 102/80    Left knee (compared to normal) -Inspection: [no] swelling, erythema, deformity or visible  effusion. -Palpation: TTP - quad tendon, + patella, - patellar tendon, - tibial tuberosity, - pes bursa, - gerdy tubercle, - medial joint line, + lateral joint line, - posterior knee, - medial and lateral hamstrings.  Moderate crepitus with flexion/extension. -AROM/PROM: 0 degrees extension, 150 degrees flexion, excellent hamstring flexibility -Strength: Did not assess single-leg squat, 5/5 flexion, 5/5 extension -Special tests:    -ACL: - lachman, - lever test   -MCL: 1-2+ and painless with valgus at 0/30 degrees   -LCL: 1-2+ and painless with varus at 0/30 degrees   -PCL: - sag sign   -Meniscus: + thessaly, + McMurray,+ Apley compression   -Patellofemoral: + patellar grind      Assessment & Plan:   Aleisha is a 53 y.o. female with possibly undiagnosed Ehlers-Danlos syndrome presenting with atraumatic left knee clicking and pain for 1 month.  On exam she appears to have a positive patellar grind which reproduces her pain at home, but also experiences exquisite pain with McMurray and other meniscal testing.  I am most suspicious for a patellofemoral source of her pain and therefore believe that physical therapy will likely provide good relief of this, however there may be some involvement of her meniscus.  To further assess this, I am going to bring her back to do a full ultrasound examination of this knee and run her through diagnostic criteria for Ehlers-Danlos syndrome.

## 2024-05-19 ENCOUNTER — Ambulatory Visit

## 2024-05-19 ENCOUNTER — Other Ambulatory Visit: Payer: Self-pay

## 2024-05-19 VITALS — BP 90/68 | Ht 65.0 in | Wt 138.0 lb

## 2024-05-19 DIAGNOSIS — Q796 Ehlers-Danlos syndrome, unspecified: Secondary | ICD-10-CM | POA: Insufficient documentation

## 2024-05-19 DIAGNOSIS — M222X2 Patellofemoral disorders, left knee: Secondary | ICD-10-CM

## 2024-05-19 NOTE — Progress Notes (Signed)
   Subjective:    Patient ID: Kathryn Mosley, female    DOB: 53 y.o., 21-Mar-1971   MRN: [id number]  HPI  Chief Complaint: Follow-up   Patient presents for Ehlers-Danlos evaluation and ultrasound of left knee     Objective:   Physical Exam Vitals:   05/19/24 1059  BP: 90/68       L knee US : Osteophytic change noted in L patellofemoral joint with small amount of synovial hypertrophy.  Possible fissuring in lateral meniscus without significant extrusion.  Hypoechogenic fluid signal in IT band as it traverses the lateral joint.   Assessment & Plan:   Kathryn Mosley is a 53 y.o. female presenting for follow-up on left knee pain and for a formal evaluation for Ehlers-Danlos syndrome.  She does meet criteria for Ehlers-Danlos syndrome and we will be ordering an echocardiogram to rule out vascular Ehlers-Danlos and additional lab work which I do not see done on her in our system evaluating for other concomitant inflammatory disorders.  I will follow-up with these labs and we will discuss the results at a follow-up visit together.  With regard to her left knee, I see evidence of an area of hypertrophic synovium that could be a source of irritation for her as well as a questionable meniscus tear and he definitely irritated and tender fortune of her IT band traverses the lateral joint line.  Suspect all 3 views may be contributing to her current pain pattern but believe that physical therapy will be the best next step.  Follow-up in 6 weeks to reassess and if still painful plan on getting an MRI.

## 2024-05-20 LAB — CBC
Hematocrit: 45.5 % (ref 34.0–46.6)
Hemoglobin: 14.9 g/dL (ref 11.1–15.9)
MCH: 31.6 pg (ref 26.6–33.0)
MCHC: 32.7 g/dL (ref 31.5–35.7)
MCV: 97 fL (ref 79–97)
Platelets: 233 x10E3/uL (ref 150–450)
RBC: 4.71 x10E6/uL (ref 3.77–5.28)
RDW: 11.8 % (ref 11.7–15.4)
WBC: 7.6 x10E3/uL (ref 3.4–10.8)

## 2024-05-20 LAB — SJOGRENS SYNDROME-A EXTRACTABLE NUCLEAR ANTIBODY: ENA SSA (RO) Ab: <0.2 AI (ref 0.0–0.9)

## 2024-05-20 LAB — ANTI-DNA ANTIBODY, DOUBLE-STRANDED: dsDNA Ab: 1 [IU]/mL (ref 0–9)

## 2024-05-20 LAB — C-REACTIVE PROTEIN: CRP: <1 mg/L (ref 0–10)

## 2024-05-20 LAB — SJOGRENS SYNDROME-B EXTRACTABLE NUCLEAR ANTIBODY: ENA SSB (LA) Ab: <0.2 AI (ref 0.0–0.9)

## 2024-05-20 LAB — SEDIMENTATION RATE: Sed Rate: 2 mm/h (ref 0–40)

## 2024-05-20 LAB — ANA W/REFLEX: Anti Nuclear Antibody (ANA): NEGATIVE

## 2024-05-21 ENCOUNTER — Ambulatory Visit: Payer: Self-pay

## 2024-05-23 NOTE — Therapy (Unsigned)
 OUTPATIENT PHYSICAL THERAPY LOWER EXTREMITY EVALUATION   Patient Name: Kathryn Mosley MRN: [id number] DOB:09/01/1971, 53 y.o., female Today's Date: 05/24/2024  END OF SESSION:  PT End of Session - 05/24/24 1015     Visit Number 1    Number of Visits 16    Date for PT Re-Evaluation 07/19/24    Authorization Type [employer] state plan no co-pay    PT Start Time 0930    PT Stop Time 1015    PT Time Calculation (min) 45 min    Activity Tolerance Patient tolerated treatment well          Past Medical History:  Diagnosis Date   Allergy 11/1972   Gluten   Anemia    Arthritis    GERD (gastroesophageal reflux disease)    Past Surgical History:  Procedure Laterality Date   CESAREAN SECTION  1994   COSMETIC SURGERY  1989   Rhinoplasty/Liposuction   Patient Active Problem List   Diagnosis Date Noted   Ehlers-Danlos disease 05/19/2024   Bilateral carpal tunnel syndrome 10/10/2022   Celiac disease 02/19/2021   DDD (degenerative disc disease), cervical 01/31/2021   Lumbar spondylosis 01/31/2021   Labral tear of hip, degenerative 01/31/2021   Folate deficiency 01/20/2020   Vitamin D deficiency 01/20/2020   GERD (gastroesophageal reflux disease) 01/20/2020   Primary osteoarthritis of left hip 10/24/2017   FH: breast cancer in first degree relative 09/22/2013   Perimenopausal 11/25/2012   H/O abnormal cervical Papanicolaou smear 08/01/2011   Hypothyroidism 04/14/2009    PCP: Dr Torrence CINDERELLA Barrier  REFERRING PROVIDER: Dr Redell DELENA Robes   REFERRING DIAG: L knee patellofemoral pain   THERAPY DIAG:  Acute pain of left knee  Patellofemoral pain syndrome of left knee  Other symptoms and signs involving the musculoskeletal system  Muscle weakness (generalized)  Rationale for Evaluation and Treatment: Rehabilitation  ONSET DATE: 04/04/24  SUBJECTIVE:   SUBJECTIVE STATEMENT: Patient reports that she has had L knee pain over the past six weeks. She does not remember any recent  incident or accident. She did have injury to L knee while running ~ 4 yrs ago which gradually resolved. Currently having pain and popping in the L knee with walking most of the time. Pain increases with increased time walking. She has been sedentary in the past three years due to medical conditions noted below  PERTINENT HISTORY: Ehlers-Danlos, a-fib; POTS; Lyme disease; chronic LBP; lumbar spondylosis; cervical pain; spinal narrowing; ACL repair Rt x 2; torn labrum bilat hips s/p debridement ~ 7 yrs ago Rt; 6 yrs Lt; c-section 1994  PAIN:  Are you having pain? Yes: NPRS scale: 0/10 sitting or lying down; 5/10 with activity  Pain location: L knee laterally will have pain on the inside of the knee with increased activity  Pain description: aching and sharp  Aggravating factors: walking > 1 block or 5 min; jumping  Relieving factors: inactivity   PRECAUTIONS: None  RED FLAGS: None   WEIGHT BEARING RESTRICTIONS: No  FALLS:  Has patient fallen in last 6 months? No  LIVING ENVIRONMENT: Lives with: lives with their spouse Lives in: House/apartment Stairs: No Has following equipment at home: None  OCCUPATION: self employed free Conservation officer, historic buildings; housheold chores; was walking ~ 1 hour daily and doing hiking prior to chronic medical conditions   PLOF: Independent  PATIENT GOALS: stop clicking; be able to walk 1 mile   NEXT MD VISIT: 06/30/24  OBJECTIVE:  Note: Objective measures were completed at Evaluation unless otherwise  noted.  DIAGNOSTIC FINDINGS:  xrays L knee results not available   05/19/24: L knee US : Osteophytic change noted in L patellofemoral joint with small amount of synovial hypertrophy.  Possible fissuring in lateral meniscus without significant extrusion.  Hypoechogenic fluid signal in IT band as it traverses the lateral joint.   PATIENT SURVEYS:  PSFS: THE PATIENT SPECIFIC FUNCTIONAL SCALE  Place score of 0-10 (0 = unable to perform activity and 10 = able to  perform activity at the same level as before injury or problem)  Activity Date: 05/24/24    Walking > 5 min  6    2. Jumping  2    3. Squatting  0    4.  0    Total Score 8      Total Score = Sum of activity scores/number of activities 8/3= 2.67  Minimally Detectable Change: 3 points (for single activity); 2 points (for average score)  [facility] (nd). The Patient Specific Functional Scale . Retrieved from [email or web address]   COGNITION: Overall cognitive status: Within functional limits for tasks assessed     SENSATION: WFL  EDEMA:  none  MUSCLE LENGTH: Hamstrings: Right 70 deg; Left 65 deg Tight hip flexors bilat   POSTURE: rounded shoulders, forward head, anterior pelvic tilt, flexed trunk , and R hip elevated compared to L; stands with knees hyperextended   PALPATION: Tightness lateral distal quad at insertion to lateral superior pole of patella  Pain R > L iliacus   LOWER EXTREMITY ROM: WFL's   Active ROM Right eval Left eval  Hip flexion    Hip extension    Hip abduction    Hip adduction    Hip internal rotation    Hip external rotation    Knee flexion    Knee extension    Ankle dorsiflexion    Ankle plantarflexion    Ankle inversion    Ankle eversion     (Blank rows = not tested)  LOWER EXTREMITY MMT:  MMT Right eval Left eval  Hip flexion 4+ 4-  Hip extension 4 4  Hip abduction 4+ 4-  Hip adduction    Hip internal rotation    Hip external rotation    Knee flexion 5 4  Knee extension 5 4  Ankle dorsiflexion    Ankle plantarflexion    Ankle inversion    Ankle eversion     (Blank rows = not tested)  LOWER EXTREMITY SPECIAL TESTS:  Knee special tests: Patellafemoral apprehension test: positive  and Patellafemoral grind test: positive   FUNCTIONAL TESTS:  5 times sit to stand: 11.21 sec SLS R 10 sec unsteady; L 10 sec unsteady with use of UE for balance   GAIT: Distance  walked: 40 feet Assistive device utilized: None Level of assistance: Complete Independence Comments: wide based gait  TREATMENT DATE: POC; HEP; kinesotaping for patellofemoral pain and alignment    PATIENT EDUCATION:  Education details: POC; HEP  Person educated: Patient Education method: Explanation, Demonstration, Tactile cues, Verbal cues, and Handouts Education comprehension: verbalized understanding, returned demonstration, verbal cues required, tactile cues required, and needs further education  HOME EXERCISE PROGRAM: Access Code: [email or web address] URL: [email or web address] Date: 05/24/2024 Prepared by: Nanie Dunkleberger  Exercises - Supine Quad Set  - 1 x daily - 7 x weekly - 1 sets - 10 reps - 3 sec  hold - Straight Leg Raise with External Rotation  - 1 x daily - 7 x weekly - 1 sets - 10 reps - 3-5 sec  hold - Hooklying Hamstring Stretch with Strap  - 2 x daily - 7 x weekly - 1 sets - 3 reps - 30 sec  hold - Supine ITB Stretch with Strap  - 2 x daily - 7 x weekly - 1 sets - 3 reps - 30 sec  hold  ASSESSMENT:  CLINICAL IMPRESSION: Patient is a 52 y.o. female who was seen today for physical therapy evaluation and treatment for L knee patellofemoral pain.   OBJECTIVE IMPAIRMENTS: Abnormal gait, decreased activity tolerance, decreased balance, decreased strength, improper body mechanics, and pain.   ACTIVITY LIMITATIONS: lifting, bending, standing, squatting, stairs, and locomotion level  PARTICIPATION LIMITATIONS: meal prep, cleaning, laundry, driving, shopping, and community activity  PERSONAL FACTORS: Fitness, Past/current experiences, Time since onset of injury/illness/exacerbation, and comorbidities noted above are also affecting patient's functional outcome.   REHAB POTENTIAL: Good  CLINICAL DECISION MAKING: Evolving/moderate  complexity  EVALUATION COMPLEXITY: Moderate   GOALS: Goals reviewed with patient? Yes  SHORT TERM GOALS: Target date: 06/21/2024   Independent in initial HEP  Baseline: Goal status: INITIAL  2.  Patient reports ability to walk without L knee popping Baseline:  Goal status: INITIAL  3.  Patient reports increased walking tolerance to 10 min without increased L knee pain  Baseline:  Goal status: INITIAL   LONG TERM GOALS: Target date: 07/19/2024   Decrease L knee pain by 50-75% allowing patient to increase functional activities and exercises tolerance  Baseline:  Goal status: INITIAL  2.  Increase strength bilat LE's to 4+/5 to 5/5  Baseline:  Goal status: INITIAL  3.  Patient reports ability to tolerate exercise video involving jumping with minimal to no increase in L knee pain  Baseline:  Goal status: INITIAL  4.  Patient reports and demonstrates ability to squat and return to stand with minimal to no increase in L knee pain  Baseline:  Goal status: INITIAL  5.  PSFS: THE PATIENT SPECIFIC FUNCTIONAL SCALE  Place score of 0-10 (0 = unable to perform activity and 10 = able to perform activity at the same level as before injury or problem)  Activity Date: 05/24/24    Walking > 5 min  6    2. Jumping  2    3. Squatting  0    4.  0    Total Score 8      Total Score = Sum of activity scores/number of activities 8/3= 2.67  Minimally Detectable Change: 3 points (for single activity); 2 points (for average score) Baseline:  Goal status: INITIAL  6.  Independent in advanced HEP including aquatic program as indicated  Baseline:  Goal status: INITIAL   PLAN:  PT FREQUENCY: 2x/week  PT DURATION: 8 weeks  PLANNED INTERVENTIONS: 97164- PT Re-evaluation, 97110-Therapeutic exercises, 97530- Therapeutic activity, [id number]- Neuromuscular re-education, 97535- Self  Care, [id number]- Manual therapy, [phone]- Gait training, [phone]- Aquatic Therapy, Patient/Family education, Balance  training, Stair training, Taping, and Joint mobilization  PLAN FOR NEXT SESSION: review and progress exercises; continue with joint conservation and ergonomic education; manual work and modalities as indicated    Taneshia Lorence P Edye Hainline, PT 05/24/2024, 12:32 PM

## 2024-05-24 ENCOUNTER — Encounter: Payer: Self-pay | Admitting: Rehabilitative and Restorative Service Providers"

## 2024-05-24 ENCOUNTER — Ambulatory Visit: Admitting: Rehabilitative and Restorative Service Providers"

## 2024-05-24 ENCOUNTER — Other Ambulatory Visit: Payer: Self-pay

## 2024-05-24 DIAGNOSIS — R29898 Other symptoms and signs involving the musculoskeletal system: Secondary | ICD-10-CM | POA: Insufficient documentation

## 2024-05-24 DIAGNOSIS — M222X2 Patellofemoral disorders, left knee: Secondary | ICD-10-CM | POA: Diagnosis present

## 2024-05-24 DIAGNOSIS — M6281 Muscle weakness (generalized): Secondary | ICD-10-CM | POA: Diagnosis present

## 2024-05-24 DIAGNOSIS — M25562 Pain in left knee: Secondary | ICD-10-CM | POA: Insufficient documentation

## 2024-05-31 ENCOUNTER — Encounter: Payer: Self-pay | Admitting: Rehabilitative and Restorative Service Providers"

## 2024-05-31 ENCOUNTER — Ambulatory Visit: Admitting: Rehabilitative and Restorative Service Providers"

## 2024-05-31 DIAGNOSIS — M6281 Muscle weakness (generalized): Secondary | ICD-10-CM

## 2024-05-31 DIAGNOSIS — M25562 Pain in left knee: Secondary | ICD-10-CM | POA: Diagnosis not present

## 2024-05-31 DIAGNOSIS — M222X2 Patellofemoral disorders, left knee: Secondary | ICD-10-CM

## 2024-05-31 DIAGNOSIS — R29898 Other symptoms and signs involving the musculoskeletal system: Secondary | ICD-10-CM

## 2024-05-31 NOTE — Therapy (Signed)
 OUTPATIENT PHYSICAL THERAPY LOWER EXTREMITY TREATMENT    Patient Name: Kathryn Mosley MRN: [id number] DOB:1971/08/07, 53 y.o., female Today's Date: 05/31/2024  END OF SESSION:  PT End of Session - 05/31/24 1014     Visit Number 2    Number of Visits 16    Date for PT Re-Evaluation 07/19/24    Authorization Type [employer] state plan no co-pay    PT Start Time 1014    PT Stop Time 1054    PT Time Calculation (min) 40 min    Activity Tolerance Patient tolerated treatment well          Past Medical History:  Diagnosis Date   Allergy 11/1972   Gluten   Anemia    Arthritis    GERD (gastroesophageal reflux disease)    Past Surgical History:  Procedure Laterality Date   CESAREAN SECTION  1994   COSMETIC SURGERY  1989   Rhinoplasty/Liposuction   Patient Active Problem List   Diagnosis Date Noted   Ehlers-Danlos disease 05/19/2024   Bilateral carpal tunnel syndrome 10/10/2022   Celiac disease 02/19/2021   DDD (degenerative disc disease), cervical 01/31/2021   Lumbar spondylosis 01/31/2021   Labral tear of hip, degenerative 01/31/2021   Folate deficiency 01/20/2020   Vitamin D deficiency 01/20/2020   GERD (gastroesophageal reflux disease) 01/20/2020   Primary osteoarthritis of left hip 10/24/2017   FH: breast cancer in first degree relative 09/22/2013   Perimenopausal 11/25/2012   H/O abnormal cervical Papanicolaou smear 08/01/2011   Hypothyroidism 04/14/2009    PCP: Dr Torrence CINDERELLA Barrier  REFERRING PROVIDER: Dr Redell DELENA Robes   REFERRING DIAG: L knee patellofemoral pain   THERAPY DIAG:  Acute pain of left knee  Patellofemoral pain syndrome of left knee  Other symptoms and signs involving the musculoskeletal system  Muscle weakness (generalized)  Rationale for Evaluation and Treatment: Rehabilitation  ONSET DATE: 04/04/24  SUBJECTIVE:   SUBJECTIVE STATEMENT: Patient reports that the knee was popping by the time she got home. She wore the tape through the week.  She has done the exercises. Knee has felt a little more irritated at times.   Eval: Patient reports that she has had L knee pain over the past six weeks. She does not remember any recent incident or accident. She did have injury to L knee while running ~ 4 yrs ago which gradually resolved. Currently having pain and popping in the L knee with walking most of the time. Pain increases with increased time walking. She has been sedentary in the past three years due to medical conditions noted below  PERTINENT HISTORY: Ehlers-Danlos, a-fib; POTS; Lyme disease; chronic LBP; lumbar spondylosis; cervical pain; spinal narrowing; ACL repair Rt x 2; torn labrum bilat hips s/p debridement ~ 7 yrs ago Rt; 6 yrs Lt; c-section 1994  PAIN:  Are you having pain? Yes: NPRS scale: 3/10 sitting or lying down; 6/10 with activity  Pain location: L knee laterally will have pain on the inside of the knee with increased activity  Pain description: aching and sharp  Aggravating factors: walking > 1 block or 5 min; jumping  Relieving factors: inactivity   PRECAUTIONS: None  WEIGHT BEARING RESTRICTIONS: No  FALLS:  Has patient fallen in last 6 months? No  LIVING ENVIRONMENT: Lives with: lives with their spouse Lives in: House/apartment Stairs: No Has following equipment at home: None  OCCUPATION: self employed free Conservation officer, historic buildings; housheold chores; was walking ~ 1 hour daily and doing hiking prior to chronic medical conditions  PATIENT GOALS: stop clicking; be able to walk 1 mile   NEXT MD VISIT: 06/30/24  OBJECTIVE:  Note: Objective measures were completed at Evaluation unless otherwise noted.  DIAGNOSTIC FINDINGS:  xrays L knee results not available   05/19/24: L knee US : Osteophytic change noted in L patellofemoral joint with small amount of synovial hypertrophy.  Possible fissuring in lateral meniscus without significant extrusion.  Hypoechogenic fluid signal in IT band as it traverses the lateral  joint.   PATIENT SURVEYS:  PSFS: THE PATIENT SPECIFIC FUNCTIONAL SCALE  Place score of 0-10 (0 = unable to perform activity and 10 = able to perform activity at the same level as before injury or problem)  Activity Date: 05/24/24    Walking > 5 min  6    2. Jumping  2    3. Squatting  0    4.  0    Total Score 8      Total Score = Sum of activity scores/number of activities 8/3= 2.67  Minimally Detectable Change: 3 points (for single activity); 2 points (for average score)  [facility] (nd). The Patient Specific Functional Scale . Retrieved from [email or web address]   COGNITION: Overall cognitive status: Within functional limits for tasks assessed     EDEMA:  none  MUSCLE LENGTH: Hamstrings: Right 70 deg; Left 65 deg Tight hip flexors bilat   POSTURE: rounded shoulders, forward head, anterior pelvic tilt, flexed trunk , and R hip elevated compared to L; stands with knees hyperextended   PALPATION: Tightness lateral distal quad at insertion to lateral superior pole of patella  Pain R > L iliacus   LOWER EXTREMITY ROM: WFL's   Active ROM Right eval Left eval  Hip flexion    Hip extension    Hip abduction    Hip adduction    Hip internal rotation    Hip external rotation    Knee flexion    Knee extension    Ankle dorsiflexion    Ankle plantarflexion    Ankle inversion    Ankle eversion     (Blank rows = not tested)  LOWER EXTREMITY MMT:  MMT Right eval Left eval  Hip flexion 4+ 4-  Hip extension 4 4  Hip abduction 4+ 4-  Hip adduction    Hip internal rotation    Hip external rotation    Knee flexion 5 4  Knee extension 5 4  Ankle dorsiflexion    Ankle plantarflexion    Ankle inversion    Ankle eversion     (Blank rows = not tested)  LOWER EXTREMITY SPECIAL TESTS:  Knee special tests: Patellafemoral apprehension test: positive  and Patellafemoral grind test: positive   FUNCTIONAL  TESTS:  5 times sit to stand: 11.21 sec SLS R 10 sec unsteady; L 10 sec unsteady with use of UE for balance   GAIT: Distance walked: 40 feet Assistive device utilized: None Level of assistance: Complete Independence Comments: wide based gait   [facility] Adult PT Treatment:                                                DATE: 05/31/24 Therapeutic Exercise: Hamstring stretch 30 sec x 2 IT band stretch 30 sec x 3  Manual Therapy: Medial glide for patella Kinesotaping for patella realignment  Neuromuscular re-ed: Quad set 3 sec x 10  x 2 (suggested grading contraction to sub max contraction)  Therapeutic Activity: SLR in ER (adjusted to sub max contraction)  Sidelying hip adduction L LE  Self Care: Use and care for tape   Trial of meditation to address anxiety and sleeplessness    PATIENT EDUCATION:  Education details: POC; HEP  Person educated: Patient Education method: Explanation, Demonstration, Tactile cues, Verbal cues, and Handouts Education comprehension: verbalized understanding, returned demonstration, verbal cues required, tactile cues required, and needs further education  HOME EXERCISE PROGRAM: Access Code: [email or web address] URL: [email or web address] Date: 05/31/2024 Prepared by: Maxi Carreras  Exercises - Supine Quad Set  - 1 x daily - 7 x weekly - 1 sets - 10 reps - 3 sec  hold - Straight Leg Raise with External Rotation  - 1 x daily - 7 x weekly - 1 sets - 10 reps - 3-5 sec  hold - Hooklying Hamstring Stretch with Strap  - 1 x daily - 7 x weekly - 1 sets - 3 reps - 30 sec  hold - Supine ITB Stretch with Strap  - 1 x daily - 7 x weekly - 1 sets - 3 reps - 30 sec  hold - Hip Flexor Stretch at Edge of Bed  - 1 x daily - 7 x weekly - 1 sets - 3 reps - 30 sec  hold - Sidelying Hip Adduction  - 1 x daily - 7 x weekly - 1-2 sets - 10 reps - 3 sec  hold - Supine Transversus Abdominis Bracing with Pelvic Floor Contraction  - 2 x daily - 7 x weekly - 1 sets - 10 reps -  10sec  hold - Wall Quarter Squat  - 2 x daily - 7 x weekly - 1-2 sets - 10 reps - 5-10 sec  hold  ASSESSMENT:  CLINICAL IMPRESSION: Patient returns reporting compliance with HEP. Reviewed and progressed exercises. Continued trial of taping to correct patellar alignment. Patient encouraged patient to avoid max contraction which may be irritating the knee. Added core exercise to improve alignment and stability through the lumbar spine and LE's. No audible clicking with walking post treatment.   Eval: Patient is a 53 y.o. female who was seen today for physical therapy evaluation and treatment for L knee patellofemoral pain.   OBJECTIVE IMPAIRMENTS: Abnormal gait, decreased activity tolerance, decreased balance, decreased strength, improper body mechanics, and pain.    GOALS: Goals reviewed with patient? Yes  SHORT TERM GOALS: Target date: 06/21/2024   Independent in initial HEP  Baseline: Goal status: INITIAL  2.  Patient reports ability to walk without L knee popping Baseline:  Goal status: INITIAL  3.  Patient reports increased walking tolerance to 10 min without increased L knee pain  Baseline:  Goal status: INITIAL   LONG TERM GOALS: Target date: 07/19/2024   Decrease L knee pain by 50-75% allowing patient to increase functional activities and exercises tolerance  Baseline:  Goal status: INITIAL  2.  Increase strength bilat LE's to 4+/5 to 5/5  Baseline:  Goal status: INITIAL  3.  Patient reports ability to tolerate exercise video involving jumping with minimal to no increase in L knee pain  Baseline:  Goal status: INITIAL  4.  Patient reports and demonstrates ability to squat and return to stand with minimal to no increase in L knee pain  Baseline:  Goal status: INITIAL  5.  PSFS: THE PATIENT SPECIFIC FUNCTIONAL SCALE  Place score of 0-10 (0 = unable to perform activity and 10 =  able to perform activity at the same level as before injury or problem)  Activity  Date: 05/24/24    Walking > 5 min  6    2. Jumping  2    3. Squatting  0    4.  0    Total Score 8      Total Score = Sum of activity scores/number of activities 8/3= 2.67  Minimally Detectable Change: 3 points (for single activity); 2 points (for average score) Baseline:  Goal status: INITIAL  6.  Independent in advanced HEP including aquatic program as indicated  Baseline:  Goal status: INITIAL   PLAN:  PT FREQUENCY: 2x/week  PT DURATION: 8 weeks  PLANNED INTERVENTIONS: 97164- PT Re-evaluation, 97110-Therapeutic exercises, 97530- Therapeutic activity, 97112- Neuromuscular re-education, 97535- Self Care, 02859- Manual therapy, [phone]- Gait training, [phone]- Aquatic Therapy, Patient/Family education, Balance training, Stair training, Taping, and Joint mobilization  PLAN FOR NEXT SESSION: review and progress exercises; continue with joint conservation and ergonomic education; manual work and modalities as indicated    [employer], PT 05/31/2024, 10:54 AM

## 2024-06-02 ENCOUNTER — Encounter: Admitting: Rehabilitative and Restorative Service Providers"

## 2024-06-14 ENCOUNTER — Ambulatory Visit: Admitting: Rehabilitative and Restorative Service Providers"

## 2024-06-14 ENCOUNTER — Encounter: Payer: Self-pay | Admitting: Rehabilitative and Restorative Service Providers"

## 2024-06-14 DIAGNOSIS — M6281 Muscle weakness (generalized): Secondary | ICD-10-CM

## 2024-06-14 DIAGNOSIS — M25562 Pain in left knee: Secondary | ICD-10-CM

## 2024-06-14 DIAGNOSIS — R29898 Other symptoms and signs involving the musculoskeletal system: Secondary | ICD-10-CM

## 2024-06-14 DIAGNOSIS — M222X2 Patellofemoral disorders, left knee: Secondary | ICD-10-CM

## 2024-06-14 NOTE — Therapy (Signed)
 OUTPATIENT PHYSICAL THERAPY LOWER EXTREMITY TREATMENT    Patient Name: Kathryn Mosley MRN: [id number] DOB:08-09-1971, 53 y.o., female Today's Date: 06/14/2024  END OF SESSION:  PT End of Session - 06/14/24 0934     Visit Number 3    Number of Visits 16    Authorization Type Aetna state plan no co-pay    PT Start Time 0930    PT Stop Time 1015    PT Time Calculation (min) 45 min    Activity Tolerance Patient tolerated treatment well          Past Medical History:  Diagnosis Date   Allergy 11/1972   Gluten   Anemia    Arthritis    GERD (gastroesophageal reflux disease)    Past Surgical History:  Procedure Laterality Date   CESAREAN SECTION  1994   COSMETIC SURGERY  1989   Rhinoplasty/Liposuction   Patient Active Problem List   Diagnosis Date Noted   Ehlers-Danlos disease 05/19/2024   Bilateral carpal tunnel syndrome 10/10/2022   Celiac disease 02/19/2021   DDD (degenerative disc disease), cervical 01/31/2021   Lumbar spondylosis 01/31/2021   Labral tear of hip, degenerative 01/31/2021   Folate deficiency 01/20/2020   Vitamin D deficiency 01/20/2020   GERD (gastroesophageal reflux disease) 01/20/2020   Primary osteoarthritis of left hip 10/24/2017   FH: breast cancer in first degree relative 09/22/2013   Perimenopausal 11/25/2012   H/O abnormal cervical Papanicolaou smear 08/01/2011   Hypothyroidism 04/14/2009    PCP: Dr Torrence CINDERELLA Barrier  REFERRING PROVIDER: Dr Redell DELENA Robes   REFERRING DIAG: L knee patellofemoral pain   THERAPY DIAG:  Acute pain of left knee  Patellofemoral pain syndrome of left knee  Other symptoms and signs involving the musculoskeletal system  Muscle weakness (generalized)  Rationale for Evaluation and Treatment: Rehabilitation  ONSET DATE: 04/04/24  SUBJECTIVE:   SUBJECTIVE STATEMENT: Patient reports that the knee is getting some better. Patient reports that awoke 2 years ago with terrible back pain and from that time she  has had trouble lifting her L leg. The popping in her knee happens after a longer period of time. She has done the exercises. Knee has felt a little more irritated at times.   Eval: Patient reports that she has had L knee pain over the past six weeks. She does not remember any recent incident or accident. She did have injury to L knee while running ~ 4 yrs ago which gradually resolved. Currently having pain and popping in the L knee with walking most of the time. Pain increases with increased time walking. She has been sedentary in the past three years due to medical conditions noted below  PERTINENT HISTORY: Ehlers-Danlos, a-fib; POTS; Lyme disease; chronic LBP; lumbar spondylosis; cervical pain; spinal narrowing; ACL repair Rt x 2; torn labrum bilat hips s/p debridement ~ 7 yrs ago Rt; 6 yrs Lt; c-section 1994  PAIN:  Are you having pain? Yes: NPRS scale: 2/10 sitting or lying down; 6/10 with activity  Pain location: L knee laterally will have pain on the inside of the knee with increased activity  Pain description: aching and sharp  Aggravating factors: walking > 1 block or 5 min; jumping  Relieving factors: inactivity   PRECAUTIONS: None  WEIGHT BEARING RESTRICTIONS: No  FALLS:  Has patient fallen in last 6 months? No  LIVING ENVIRONMENT: Lives with: lives with their spouse Lives in: House/apartment Stairs: No Has following equipment at home: None  OCCUPATION: self employed free Conservation officer, historic buildings;  housheold chores; was walking ~ 1 hour daily and doing hiking prior to chronic medical conditions   PATIENT GOALS: stop clicking; be able to walk 1 mile   NEXT MD VISIT: 06/30/24  OBJECTIVE:  Note: Objective measures were completed at Evaluation unless otherwise noted.  DIAGNOSTIC FINDINGS:  xrays L knee results not available   05/19/24: L knee US : Osteophytic change noted in L patellofemoral joint with small amount of synovial hypertrophy.  Possible fissuring in lateral meniscus  without significant extrusion.  Hypoechogenic fluid signal in IT band as it traverses the lateral joint.   PATIENT SURVEYS:  PSFS: THE PATIENT SPECIFIC FUNCTIONAL SCALE  Place score of 0-10 (0 = unable to perform activity and 10 = able to perform activity at the same level as before injury or problem)  Activity Date: 05/24/24    Walking > 5 min  6    2. Jumping  2    3. Squatting  0    4.  0    Total Score 8      Total Score = Sum of activity scores/number of activities 8/3= 2.67  Minimally Detectable Change: 3 points (for single activity); 2 points (for average score)  [facility] (nd). The Patient Specific Functional Scale . Retrieved from [email or web address]     MUSCLE LENGTH: Hamstrings: Right 70 deg; Left 65 deg Tight hip flexors bilat   POSTURE: rounded shoulders, forward head, anterior pelvic tilt, flexed trunk , and R hip elevated compared to L; stands with knees hyperextended   PALPATION: Tightness lateral distal quad at insertion to lateral superior pole of patella  Pain R > L iliacus   LOWER EXTREMITY ROM: WFL's   Active ROM Right eval Left eval  Hip flexion    Hip extension    Hip abduction    Hip adduction    Hip internal rotation    Hip external rotation    Knee flexion    Knee extension    Ankle dorsiflexion    Ankle plantarflexion    Ankle inversion    Ankle eversion     (Blank rows = not tested)  LOWER EXTREMITY MMT:  MMT Right eval Left eval  Hip flexion 4+ 4-  Hip extension 4 4  Hip abduction 4+ 4-  Hip adduction    Hip internal rotation    Hip external rotation    Knee flexion 5 4  Knee extension 5 4  Ankle dorsiflexion    Ankle plantarflexion    Ankle inversion    Ankle eversion     (Blank rows = not tested)  LOWER EXTREMITY SPECIAL TESTS:  Knee special tests: Patellafemoral apprehension test: positive  and Patellafemoral grind test: positive   FUNCTIONAL  TESTS:  5 times sit to stand: 11.21 sec SLS R 10 sec unsteady; L 10 sec unsteady with use of UE for balance   GAIT: Distance walked: 40 feet Assistive device utilized: None Level of assistance: Complete Independence Comments: wide based gait   [facility] Adult PT Treatment:                                                DATE: 06/14/24 Therapeutic Exercise: Hamstring stretch 30 sec x 2 IT band stretch 30 sec x 3  Manual Therapy: Iliacus release bilat 90 sec each side Medial glide for patella Kinesotaping for patella realignment  Neuromuscular re-ed: Myofacial ball release psoas and gluts/piriformis bilat  Pelvis realignment supine and standing 2 sec x 10  Quad set 3 sec x 10 x 2 (suggested grading contraction to sub max contraction)  Therapeutic Activity: SLR in ER (adjusted to sub max contraction)  Bridge 5 sec hold x 10; repeated with ball btn knees 5 sec x 10  Wall squat with ball btn knees 5 sec x 10  Sidelying hip adduction L LE  Self Care: Kinesotaping for patellar alignment  Ball release work    [facility] Adult PT Treatment:                                                DATE: 05/31/24 Therapeutic Exercise: Hamstring stretch 30 sec x 2 IT band stretch 30 sec x 3  Manual Therapy: Medial glide for patella Kinesotaping for patella realignment  Neuromuscular re-ed: Quad set 3 sec x 10 x 2 (suggested grading contraction to sub max contraction)  Therapeutic Activity: SLR in ER (adjusted to sub max contraction)  Sidelying hip adduction L LE  Self Care: Use and care for tape   Trial of meditation to address anxiety and sleeplessness    PATIENT EDUCATION:  Education details: POC; HEP  Person educated: Patient Education method: Explanation, Demonstration, Tactile cues, Verbal cues, and Handouts Education comprehension: verbalized understanding, returned demonstration, verbal cues required, tactile cues required, and needs further education  HOME EXERCISE PROGRAM: Access Code:  [email or web address] URL: [email or web address] Date: 06/14/2024 Prepared by: Ramiyah Mcclenahan  Exercises - Supine Quad Set  - 1 x daily - 7 x weekly - 1 sets - 10 reps - 3 sec  hold - Straight Leg Raise with External Rotation  - 1 x daily - 7 x weekly - 1 sets - 10 reps - 3-5 sec  hold - Hooklying Hamstring Stretch with Strap  - 1 x daily - 7 x weekly - 1 sets - 3 reps - 30 sec  hold - Supine ITB Stretch with Strap  - 1 x daily - 7 x weekly - 1 sets - 3 reps - 30 sec  hold - Hip Flexor Stretch at Edge of Bed  - 1 x daily - 7 x weekly - 1 sets - 3 reps - 30 sec  hold - Sidelying Hip Adduction  - 1 x daily - 7 x weekly - 1-2 sets - 10 reps - 3 sec  hold - Supine Transversus Abdominis Bracing with Pelvic Floor Contraction  - 2 x daily - 7 x weekly - 1 sets - 10 reps - 10sec  hold - Wall Quarter Squat  - 2 x daily - 7 x weekly - 1-2 sets - 10 reps - 5-10 sec  hold - Bridge  - 2 x daily - 7 x weekly - 1-2 sets - 10 reps - 5 sec  hold - Supine Bridge with Mini Swiss Ball Between Knees  - 1 x daily - 7 x weekly - 1-2 sets - 10 reps - 5 sec  hold - Wall Squat Hold with Ball  - 1 x daily - 7 x weekly - 1-2 sets - 10 reps - 5-10 sec  hold  ASSESSMENT:  CLINICAL IMPRESSION: Patient returns some improvement in knee pain and popping. She is working on exercises at home. Reviewed and progressed exercises. Continued trial of taping to correct patellar alignment.  Added release for iliacus; myofacial ball release for psoas and gluts/piriformis. Patient has multiple MD appointments and has difficulty scheduling consistent PT appointments.   Eval: Patient is a 53 y.o. female who was seen today for physical therapy evaluation and treatment for L knee patellofemoral pain.   OBJECTIVE IMPAIRMENTS: Abnormal gait, decreased activity tolerance, decreased balance, decreased strength, improper body mechanics, and pain.    GOALS: Goals reviewed with patient? Yes  SHORT TERM GOALS: Target date:  06/21/2024   Independent in initial HEP  Baseline: Goal status: INITIAL  2.  Patient reports ability to walk without L knee popping Baseline:  Goal status: INITIAL  3.  Patient reports increased walking tolerance to 10 min without increased L knee pain  Baseline:  Goal status: INITIAL   LONG TERM GOALS: Target date: 07/19/2024   Decrease L knee pain by 50-75% allowing patient to increase functional activities and exercises tolerance  Baseline:  Goal status: INITIAL  2.  Increase strength bilat LE's to 4+/5 to 5/5  Baseline:  Goal status: INITIAL  3.  Patient reports ability to tolerate exercise video involving jumping with minimal to no increase in L knee pain  Baseline:  Goal status: INITIAL  4.  Patient reports and demonstrates ability to squat and return to stand with minimal to no increase in L knee pain  Baseline:  Goal status: INITIAL  5.  PSFS: THE PATIENT SPECIFIC FUNCTIONAL SCALE  Place score of 0-10 (0 = unable to perform activity and 10 = able to perform activity at the same level as before injury or problem)  Activity Date: 05/24/24    Walking > 5 min  6    2. Jumping  2    3. Squatting  0    4.  0    Total Score 8      Total Score = Sum of activity scores/number of activities 8/3= 2.67  Minimally Detectable Change: 3 points (for single activity); 2 points (for average score) Baseline:  Goal status: INITIAL  6.  Independent in advanced HEP including aquatic program as indicated  Baseline:  Goal status: INITIAL   PLAN:  PT FREQUENCY: 2x/week  PT DURATION: 8 weeks  PLANNED INTERVENTIONS: 97164- PT Re-evaluation, 97110-Therapeutic exercises, 97530- Therapeutic activity, 97112- Neuromuscular re-education, 97535- Self Care, 02859- Manual therapy, [phone]- Gait training, [phone]- Aquatic Therapy, Patient/Family education, Balance training, Stair training, Taping, and Joint mobilization  PLAN FOR NEXT SESSION: review and progress exercises; continue  with joint conservation and ergonomic education; manual work and modalities as indicated    [employer], PT 06/14/2024, 9:35 AM

## 2024-06-16 ENCOUNTER — Ambulatory Visit: Admitting: Rehabilitative and Restorative Service Providers"

## 2024-06-21 ENCOUNTER — Ambulatory Visit (HOSPITAL_BASED_OUTPATIENT_CLINIC_OR_DEPARTMENT_OTHER)
Admission: RE | Admit: 2024-06-21 | Discharge: 2024-06-21 | Disposition: A | Discharge status: Home / Self Care | Source: Ambulatory Visit

## 2024-06-21 DIAGNOSIS — Q796 Ehlers-Danlos syndrome, unspecified: Secondary | ICD-10-CM | POA: Diagnosis present

## 2024-06-21 DIAGNOSIS — I361 Nonrheumatic tricuspid (valve) insufficiency: Secondary | ICD-10-CM

## 2024-06-21 LAB — ECHOCARDIOGRAM COMPLETE
AR max vel: 2.52 cm2
AV Area VTI: 2.3 cm2
AV Area mean vel: 2.46 cm2
AV Mean grad: 2.5 mmHg
AV Peak grad: 4 mmHg
Ao pk vel: 1 m/s
Area-P 1/2: 3.27 cm2
Calc EF: 67.8 %
S' Lateral: 2.2 cm
Single Plane A2C EF: 68 %
Single Plane A4C EF: 64.3 %

## 2024-06-23 ENCOUNTER — Encounter: Payer: Self-pay | Admitting: Rehabilitative and Restorative Service Providers"

## 2024-06-23 ENCOUNTER — Ambulatory Visit: Attending: Family Medicine | Admitting: Rehabilitative and Restorative Service Providers"

## 2024-06-23 DIAGNOSIS — M25562 Pain in left knee: Secondary | ICD-10-CM | POA: Insufficient documentation

## 2024-06-23 DIAGNOSIS — M6281 Muscle weakness (generalized): Secondary | ICD-10-CM | POA: Insufficient documentation

## 2024-06-23 DIAGNOSIS — R29898 Other symptoms and signs involving the musculoskeletal system: Secondary | ICD-10-CM | POA: Diagnosis present

## 2024-06-23 DIAGNOSIS — M222X2 Patellofemoral disorders, left knee: Secondary | ICD-10-CM | POA: Diagnosis present

## 2024-06-23 NOTE — Therapy (Signed)
 OUTPATIENT PHYSICAL THERAPY LOWER EXTREMITY TREATMENT    Patient Name: Kathryn Mosley MRN: [id number] DOB:[birth year], 53 y.o., female Today's Date: 06/23/2024  END OF SESSION:  PT End of Session - 06/23/24 0933     Visit Number 4    Number of Visits 16    Date for Recertification  07/19/24    Authorization Type Aetna state plan no co-pay    PT Start Time 0930    PT Stop Time 1015    PT Time Calculation (min) 45 min    Activity Tolerance Patient tolerated treatment well          Past Medical History:  Diagnosis Date   Allergy 11/1972   Gluten   Anemia    Arthritis    GERD (gastroesophageal reflux disease)    Past Surgical History:  Procedure Laterality Date   CESAREAN SECTION  1994   COSMETIC SURGERY  1989   Rhinoplasty/Liposuction   Patient Active Problem List   Diagnosis Date Noted   Ehlers-Danlos disease 05/19/2024   Bilateral carpal tunnel syndrome 10/10/2022   Celiac disease 02/19/2021   DDD (degenerative disc disease), cervical 01/31/2021   Lumbar spondylosis 01/31/2021   Labral tear of hip, degenerative 01/31/2021   Folate deficiency 01/20/2020   Vitamin D deficiency 01/20/2020   GERD (gastroesophageal reflux disease) 01/20/2020   Primary osteoarthritis of left hip 10/24/2017   FH: breast cancer in first degree relative 09/22/2013   Perimenopausal 11/25/2012   H/O abnormal cervical Papanicolaou smear 08/01/2011   Hypothyroidism 04/14/2009    PCP: Dr Torrence CINDERELLA Barrier  REFERRING PROVIDER: Dr Redell DELENA Robes   REFERRING DIAG: L knee patellofemoral pain   THERAPY DIAG:  Acute pain of left knee  Patellofemoral pain syndrome of left knee  Other symptoms and signs involving the musculoskeletal system  Muscle weakness (generalized)  Rationale for Evaluation and Treatment: Rehabilitation  ONSET DATE: 04/04/24  SUBJECTIVE:   SUBJECTIVE STATEMENT: Patient reports that the knee is improving. She is having less pain ans less clicking. She is working  on her exercises. The tape feels good.  She has done the exercises. Trying to walk 5000 steps a day which is a lot for her. Excited about the pelvic realignment. Has he son here this week for a visit. Leaves in two days. Has been great having him here. Scheduled for spinal ablation.  Eval: Patient reports that she has had L knee pain over the past six weeks. She does not remember any recent incident or accident. She did have injury to L knee while running ~ 4 yrs ago which gradually resolved. Currently having pain and popping in the L knee with walking most of the time. Pain increases with increased time walking. She has been sedentary in the past three years due to medical conditions noted below  PERTINENT HISTORY: Ehlers-Danlos, a-fib; POTS; Lyme disease; chronic LBP; lumbar spondylosis; cervical pain; spinal narrowing; ACL repair Rt x 2; torn labrum bilat hips s/p debridement ~ 7 yrs ago Rt; 6 yrs Lt; c-section 1994  PAIN:  Are you having pain? Yes: NPRS scale: 3/10 sitting or lying down; 6/10 with activity  Pain location: L knee laterally will have pain on the inside of the knee with increased activity  Pain description: aching and sharp  Aggravating factors: walking > 1 block or 5 min; jumping  Relieving factors: inactivity   PRECAUTIONS: None  WEIGHT BEARING RESTRICTIONS: No  FALLS:  Has patient fallen in last 6 months? No  LIVING ENVIRONMENT: Lives with: lives  with their spouse Lives in: House/apartment Stairs: No Has following equipment at home: None  OCCUPATION: self employed free Conservation officer, historic buildings; housheold chores; was walking ~ 1 hour daily and doing hiking prior to chronic medical conditions   PATIENT GOALS: stop clicking; be able to walk 1 mile   NEXT MD VISIT: 06/30/24  OBJECTIVE:  Note: Objective measures were completed at Evaluation unless otherwise noted.  DIAGNOSTIC FINDINGS:  xrays L knee results not available   05/19/24: L knee US : Osteophytic change noted in L  patellofemoral joint with small amount of synovial hypertrophy.  Possible fissuring in lateral meniscus without significant extrusion.  Hypoechogenic fluid signal in IT band as it traverses the lateral joint.   PATIENT SURVEYS:  PSFS: THE PATIENT SPECIFIC FUNCTIONAL SCALE  Place score of 0-10 (0 = unable to perform activity and 10 = able to perform activity at the same level as before injury or problem)  Activity Date: 05/24/24    Walking > 5 min  6    2. Jumping  2    3. Squatting  0    4.  0    Total Score 8      Total Score = Sum of activity scores/number of activities 8/3= 2.67  Minimally Detectable Change: 3 points (for single activity); 2 points (for average score)  [facility] (nd). The Patient Specific Functional Scale . Retrieved from [email or web address]     MUSCLE LENGTH: Hamstrings: Right 70 deg; Left 65 deg Tight hip flexors bilat  06/23/24: improved hamstring extensibility; gradual improving hip flexor extensibility but remains tight in iliopsoas R > L   POSTURE: rounded shoulders, forward head, anterior pelvic tilt, flexed trunk , and R hip elevated compared to L; stands with knees hyperextended   PALPATION: Tightness lateral distal quad at insertion to lateral superior pole of patella  Pain R > L iliacus   LOWER EXTREMITY ROM: WFL's   Active ROM Right eval Left eval  Hip flexion    Hip extension    Hip abduction    Hip adduction    Hip internal rotation    Hip external rotation    Knee flexion    Knee extension    Ankle dorsiflexion    Ankle plantarflexion    Ankle inversion    Ankle eversion     (Blank rows = not tested)  LOWER EXTREMITY MMT:  MMT Right eval Right  06/23/24 Left eval Left 06/23/24  Hip flexion 4+ 5 4- 5-  Hip extension 4 4+ 4 4+  Hip abduction 4+ 5 4- 4+  Hip adduction      Hip internal rotation      Hip external rotation      Knee flexion 5  4 5   Knee  extension 5  4 5   Ankle dorsiflexion      Ankle plantarflexion      Ankle inversion      Ankle eversion       (Blank rows = not tested)  LOWER EXTREMITY SPECIAL TESTS:  Knee special tests: Patellafemoral apprehension test: positive  and Patellafemoral grind test: positive   FUNCTIONAL TESTS:  5 times sit to stand: 11.21 sec SLS R 10 sec unsteady; L 10 sec unsteady with use of UE for balance   GAIT: Distance walked: 40 feet Assistive device utilized: None Level of assistance: Complete Independence Comments: wide based gait   [facility] Adult PT Treatment:  DATE: 06/23/24 Therapeutic Exercise: Hamstring stretch 30 sec x 2 IT band stretch 30 sec x 3  Manual Therapy: Iliacus release bilat 90 sec each side Medial glide for patella Kinesotaping for patella realignment  Neuromuscular re-ed: Myofacial ball release psoas and gluts/piriformis bilat  Pelvis realignment supine and standing 2 sec x 10  Quad set 3 sec x 10 x 2  Therapeutic Activity: SLR in ER (adjusted to sub max contraction)  Bridge 5 sec hold x 10; repeated with ball btn knees 5 sec x 10  Wall squat with ball btn knees 5 sec x 10  Sidelying hip adduction L LE  Self Care: Kinesotaping for patellar alignment  Ball release work   [facility] Adult PT Treatment:                                                DATE: 06/14/24 Therapeutic Exercise: Hamstring stretch 30 sec x 2 IT band stretch 30 sec x 3  Manual Therapy: Iliacus release bilat 90 sec each side Medial glide for patella Kinesotaping for patella realignment  Neuromuscular re-ed: Myofacial ball release psoas and gluts/piriformis bilat  Pelvis realignment supine and standing 2 sec x 10  Quad set 3 sec x 10 x 2 (suggested grading contraction to sub max contraction)  Therapeutic Activity: SLR in ER (adjusted to sub max contraction)  Bridge 5 sec hold x 10; repeated with ball btn knees 5 sec x 10  Wall squat with ball btn  knees 5 sec x 10  Sidelying hip adduction L LE  Self Care: Kinesotaping for patellar alignment  Ball release work    PATIENT EDUCATION:  Education details: POC; HEP  Person educated: Patient Education method: Programmer, multimedia, Facilities manager, Actor cues, Verbal cues, and Handouts Education comprehension: verbalized understanding, returned demonstration, verbal cues required, tactile cues required, and needs further education  HOME EXERCISE PROGRAM: Access Code: [email or web address] URL: [email or web address] Date: 06/23/2024 Prepared by: Keera Altidor  Exercises - Supine Quad Set  - 1 x daily - 7 x weekly - 1 sets - 10 reps - 3 sec  hold - Straight Leg Raise with External Rotation  - 1 x daily - 7 x weekly - 1 sets - 10 reps - 3-5 sec  hold - Hooklying Hamstring Stretch with Strap  - 1 x daily - 7 x weekly - 1 sets - 3 reps - 30 sec  hold - Supine ITB Stretch with Strap  - 1 x daily - 7 x weekly - 1 sets - 3 reps - 30 sec  hold - Hip Flexor Stretch at Edge of Bed  - 1 x daily - 7 x weekly - 1 sets - 3 reps - 30 sec  hold - Sidelying Hip Adduction  - 1 x daily - 7 x weekly - 1-2 sets - 10 reps - 3 sec  hold - Supine Transversus Abdominis Bracing with Pelvic Floor Contraction  - 2 x daily - 7 x weekly - 1 sets - 10 reps - 10sec  hold - Wall Quarter Squat  - 2 x daily - 7 x weekly - 1-2 sets - 10 reps - 5-10 sec  hold - Bridge  - 2 x daily - 7 x weekly - 1-2 sets - 10 reps - 5 sec  hold - Supine Bridge with Mini Swiss Ball Between Knees  - 1 x daily -  7 x weekly - 1-2 sets - 10 reps - 5 sec  hold - Wall Squat Hold with Ball  - 1 x daily - 7 x weekly - 1-2 sets - 10 reps - 5-10 sec  hold - Sit to Stand  - 1 x daily - 7 x weekly - 1 sets - 10 reps - 3-5 sec  hold  ASSESSMENT:  CLINICAL IMPRESSION: Patient reports continued improvement in knee pain and popping. Note decreased palpable tightness through the lateral quad insertion and improved tracking of patella. Positive response to release  through the iliopsoas. She is working on exercises at home. Reviewed and progressed exercises; gradually progressing LE strengthening as patient tolerates. Continued taping to correct patellar alignment as well as release for iliacus; myofacial ball release for psoas and gluts/piriformis. Patient has multiple MD appointments and has difficulty scheduling consistent PT appointments.   Eval: Patient is a 53 y.o. female who was seen today for physical therapy evaluation and treatment for L knee patellofemoral pain.   OBJECTIVE IMPAIRMENTS: Abnormal gait, decreased activity tolerance, decreased balance, decreased strength, improper body mechanics, and pain.    GOALS: Goals reviewed with patient? Yes  SHORT TERM GOALS: Target date: 06/21/2024   Independent in initial HEP  Baseline: Goal status: met  2.  Patient reports ability to walk without L knee popping Baseline:  Goal status: on going   3.  Patient reports increased walking tolerance to 10 min without increased L knee pain  Baseline:  Goal status: on going    LONG TERM GOALS: Target date: 07/19/2024   Decrease L knee pain by 50-75% allowing patient to increase functional activities and exercises tolerance  Baseline:  Goal status: on going   2.  Increase strength bilat LE's to 4+/5 to 5/5  Baseline:  Goal status: on going   3.  Patient reports ability to tolerate exercise video involving jumping with minimal to no increase in L knee pain  Baseline:  Goal status: on going   4.  Patient reports and demonstrates ability to squat and return to stand with minimal to no increase in L knee pain  Baseline:  Goal status: on going   5.  PSFS: THE PATIENT SPECIFIC FUNCTIONAL SCALE  Place score of 0-10 (0 = unable to perform activity and 10 = able to perform activity at the same level as before injury or problem)  Activity Date: 05/24/24    Walking > 5 min  6    2. Jumping  2    3. Squatting  0    4.  0    Total Score 8       Total Score = Sum of activity scores/number of activities 8/3= 2.67  Minimally Detectable Change: 3 points (for single activity); 2 points (for average score) Baseline:  Goal status: on going   6.  Independent in advanced HEP including aquatic program as indicated  Baseline:  Goal status: on going    PLAN:  PT FREQUENCY: 2x/week  PT DURATION: 8 weeks  PLANNED INTERVENTIONS: 97164- PT Re-evaluation, 97110-Therapeutic exercises, 97530- Therapeutic activity, 97112- Neuromuscular re-education, [phone]- Self Care, 02859- Manual therapy, [phone]- Gait training, [phone]- Aquatic Therapy, Patient/Family education, Balance training, Stair training, Taping, and Joint mobilization  PLAN FOR NEXT SESSION: review and progress exercises; continue with joint conservation and ergonomic education; manual work and modalities as indicated - - trial of step up and SLS as tolerated note to MD appt 06/30/24   Ronia Hazelett SHAUNNA [employer], PT 06/23/2024, 9:34 AM

## 2024-06-30 ENCOUNTER — Ambulatory Visit

## 2024-06-30 VITALS — BP 108/68 | Ht 65.0 in | Wt 138.0 lb

## 2024-06-30 DIAGNOSIS — I471 Supraventricular tachycardia, unspecified: Secondary | ICD-10-CM

## 2024-06-30 DIAGNOSIS — Q796 Ehlers-Danlos syndrome, unspecified: Secondary | ICD-10-CM

## 2024-06-30 NOTE — Progress Notes (Signed)
   Subjective:    Patient ID: Kathryn Mosley, female    DOB: 53 y.o., 1971/06/14   MRN: [id number]  Chief Complaint: EDS lab review & follow up  Discussed the use of AI scribe software for clinical note transcription with the patient, who gave verbal consent to proceed.  History of Present Illness Kathryn Mosley is a 53 year old female with Ehlers-Danlos syndrome who presents for a follow-up visit to review lab work and discuss pain management options.  Lumbar spine pain - Significant pain localized to the lower spine - No radiation to the legs - Lyrica  perceived as ineffective for pain control - Suspects pain may be related to an interaction between Lyrica  and Savella - Physical therapy has resulted in improved strength and reduced symptoms - Interested in incorporating hip exercises to further alleviate back pain - No fever, chills, or unexpected weight loss - No loss of bowel or bladder control - No numbness around the perineum or vagina - No objective weakness in the legs  Hip pain and instability - Chronic hip pain with occasional dislocations - History of hip issues since infancy, including being in casts as an infant - Threatened acetabular osteotomy ten years ago     Objective:   Vitals:   06/30/24 1045  BP: 108/68    Lumbar Spine -Inspection: no swelling or skin changes -Palpation: TTP + midline at the level of L4, - paraspinals -AROM/PROM: FROM in all planes of the low back -Strength: full hip flexion (L1/L2), knee extension (L3/4), ankle dorsiflexion (L4/5), hip extension (L5/S1), knee flexion (L5/S1/S2) plantarflexion (S1/2). -Sensation: intact sensation over the medial femoral condyle (L3), patella (L4), lateral femoral condyle (L5), lateral malleolus (S1). -Reflexes: normal patellar (L3/4), hamstring (L5/S1), achilles (S1/2) reflexes, equal bilaterally -Special tests: - Straight Leg Raise, + Stork, - Slump test      Assessment & Plan:   Assessment &  Plan Ehlers-Danlos syndrome with chronic back pain and bilateral hip dysfunction   She experiences sharp back pain centered in the middle of the spine, worsening with certain movements. Her hips are extremely painful and occasionally dislocate. Physical therapy has improved strength and function, though Lyrica 's effectiveness is reduced, possibly due to interaction with Savella. She is not interested in pharmacological adjustments at this time. Continue the current physical therapy regimen and add exercises targeting hip function. Provide home exercises for hip prehabilitation. F/u in 6 weeks.  Dysautonomia with paroxysmal supraventricular tachycardia and sinus tachycardia   Episodes of paroxysmal supraventricular tachycardia (SVT) and sinus tachycardia are present. Holter monitor results showed rare SVT and sinus tachycardia episodes, with no atrial fibrillation. Cardiology assessment suggests dysautonomia as the underlying cause, with avoidance of triggers recommended. She is seeking a second opinion on the necessity and potential benefit of an ablation procedure, given the non-life-threatening nature of her arrhythmias and potential surgical risks, especially considering chronic Lyme disease. Refer to a cardiologist for a second opinion regarding the management of SVT and sinus tachycardia. Avoid known triggers of dysautonomia.   Greater than 30 minutes of today's encounter was spent with face-to-face interaction discussing the nature of her Holter monitor findings, concern for further management of this including request for additional cardiology provider weigh in, management options for chronic back pain and bilateral hip pain, as well as the nature of how Ehlers-Danlos affects hip pathology.

## 2024-07-12 ENCOUNTER — Ambulatory Visit: Admitting: Rehabilitative and Restorative Service Providers"

## 2024-07-12 ENCOUNTER — Encounter: Payer: Self-pay | Admitting: Rehabilitative and Restorative Service Providers"

## 2024-07-12 DIAGNOSIS — M222X2 Patellofemoral disorders, left knee: Secondary | ICD-10-CM

## 2024-07-12 DIAGNOSIS — M25562 Pain in left knee: Secondary | ICD-10-CM

## 2024-07-12 DIAGNOSIS — M6281 Muscle weakness (generalized): Secondary | ICD-10-CM

## 2024-07-12 DIAGNOSIS — R29898 Other symptoms and signs involving the musculoskeletal system: Secondary | ICD-10-CM

## 2024-07-12 NOTE — Therapy (Signed)
 OUTPATIENT PHYSICAL THERAPY LOWER EXTREMITY TREATMENT    Patient Name: Kathryn Mosley MRN: [id number] DOB:1971/07/07, 53 y.o., female Today's Date: 07/12/2024  END OF SESSION:  PT End of Session - 07/12/24 1010     Visit Number 5    Number of Visits 16    Date for Recertification  07/19/24    Authorization Type [employer] state plan no co-pay    PT Start Time 1010    PT Stop Time 1055    PT Time Calculation (min) 45 min    Activity Tolerance Patient tolerated treatment well          Past Medical History:  Diagnosis Date   Allergy 11/1972   Gluten   Anemia    Arthritis    GERD (gastroesophageal reflux disease)    Past Surgical History:  Procedure Laterality Date   CESAREAN SECTION  1994   COSMETIC SURGERY  1989   Rhinoplasty/Liposuction   Patient Active Problem List   Diagnosis Date Noted   Ehlers-Danlos disease 05/19/2024   Bilateral carpal tunnel syndrome 10/10/2022   Celiac disease 02/19/2021   DDD (degenerative disc disease), cervical 01/31/2021   Lumbar spondylosis 01/31/2021   Labral tear of hip, degenerative 01/31/2021   Folate deficiency 01/20/2020   Vitamin D deficiency 01/20/2020   GERD (gastroesophageal reflux disease) 01/20/2020   Primary osteoarthritis of left hip 10/24/2017   FH: breast cancer in first degree relative 09/22/2013   Perimenopausal 11/25/2012   H/O abnormal cervical Papanicolaou smear 08/01/2011   Hypothyroidism 04/14/2009    PCP: Dr Torrence CINDERELLA Barrier  REFERRING PROVIDER: Dr Redell DELENA Robes   REFERRING DIAG: L knee patellofemoral pain   THERAPY DIAG:  Acute pain of left knee  Patellofemoral pain syndrome of left knee  Other symptoms and signs involving the musculoskeletal system  Muscle weakness (generalized)  Rationale for Evaluation and Treatment: Rehabilitation  ONSET DATE: 04/04/24  SUBJECTIVE:   SUBJECTIVE STATEMENT: Patient reports that she had knee pain with walking on steps last week and has continued to ache and  hurt with wall squats and return to return to stand. She is having less pain ans less clicking. She is working on her exercises. The tape feels good.  She has done the exercises. Trying to walk 5000 steps a day which is a lot for her. Excited about the pelvic realignment. She was scheduled for spinal ablation but procedure was cancelled until she has further cardiac evaluation. She was seen by Dr Robes last week and he wants her to work on hip strengthening.   Eval: Patient reports that she has had L knee pain over the past six weeks. She does not remember any recent incident or accident. She did have injury to L knee while running ~ 4 yrs ago which gradually resolved. Currently having pain and popping in the L knee with walking most of the time. Pain increases with increased time walking. She has been sedentary in the past three years due to medical conditions noted below  PERTINENT HISTORY: Ehlers-Danlos, a-fib; POTS; Lyme disease; chronic LBP; lumbar spondylosis; cervical pain; spinal narrowing; ACL repair Rt x 2; torn labrum bilat hips s/Kathryn debridement ~ 7 yrs ago Rt; 6 yrs Lt; c-section 1994  PAIN:  Are you having pain? Yes: NPRS scale: 0/10 at rest; 7/10 with stairs or certain activities lasting a few seconds   Pain location: L knee laterally will have pain on the inside of the knee with increased activity  Pain description: aching and sharp  Aggravating  factors: walking > 1 block or 5 min; jumping  Relieving factors: inactivity   PRECAUTIONS: None  WEIGHT BEARING RESTRICTIONS: No  FALLS:  Has patient fallen in last 6 months? No  LIVING ENVIRONMENT: Lives with: lives with their spouse Lives in: House/apartment Stairs: No Has following equipment at home: None  OCCUPATION: self employed free conservation officer, historic buildings; housheold chores; was walking ~ 1 hour daily and doing hiking prior to chronic medical conditions   PATIENT GOALS: stop clicking; be able to walk 1 mile   NEXT MD VISIT:  06/30/24  OBJECTIVE:  Note: Objective measures were completed at Evaluation unless otherwise noted.  DIAGNOSTIC FINDINGS:  xrays L knee results not available   05/19/24: L knee US : Osteophytic change noted in L patellofemoral joint with small amount of synovial hypertrophy.  Possible fissuring in lateral meniscus without significant extrusion.  Hypoechogenic fluid signal in IT band as it traverses the lateral joint.   PATIENT SURVEYS:  PSFS: THE PATIENT SPECIFIC FUNCTIONAL SCALE  Place score of 0-10 (0 = unable to perform activity and 10 = able to perform activity at the same level as before injury or problem)  Activity Date: 05/24/24    Walking > 5 min  6    2. Jumping  2    3. Squatting  0    4.  0    Total Score 8      Total Score = Sum of activity scores/number of activities 8/3= 2.67  Minimally Detectable Change: 3 points (for single activity); 2 points (for average score)  [facility] (nd). The Patient Specific Functional Scale . Retrieved from [email or web address]     MUSCLE LENGTH: Hamstrings: Right 70 deg; Left 65 deg Tight hip flexors bilat  06/23/24: improved hamstring extensibility; gradual improving hip flexor extensibility but remains tight in iliopsoas R > L   POSTURE: rounded shoulders, forward head, anterior pelvic tilt, flexed trunk , and R hip elevated compared to L; stands with knees hyperextended   PALPATION: Tightness lateral distal quad at insertion to lateral superior pole of patella  Pain R > L iliacus   LOWER EXTREMITY ROM: WFL's   Active ROM Right eval Left eval  Hip flexion    Hip extension    Hip abduction    Hip adduction    Hip internal rotation    Hip external rotation    Knee flexion    Knee extension    Ankle dorsiflexion    Ankle plantarflexion    Ankle inversion    Ankle eversion     (Blank rows = not tested)  LOWER EXTREMITY MMT:  MMT Right eval Right   06/23/24 Left eval Left 06/23/24  Hip flexion 4+ 5 4- 5-  Hip extension 4 4+ 4 4+  Hip abduction 4+ 5 4- 4+  Hip adduction      Hip internal rotation      Hip external rotation      Knee flexion 5  4 5   Knee extension 5  4 5   Ankle dorsiflexion      Ankle plantarflexion      Ankle inversion      Ankle eversion       (Blank rows = not tested)  LOWER EXTREMITY SPECIAL TESTS:  Knee special tests: Patellafemoral apprehension test: positive  and Patellafemoral grind test: positive   FUNCTIONAL TESTS:  5 times sit to stand: 11.21 sec SLS R 10 sec unsteady; L 10 sec unsteady with use of UE for balance  GAIT: Distance walked: 40 feet Assistive device utilized: None Level of assistance: Complete Independence Comments: wide based gait   [facility] Adult PT Treatment:                                                DATE: 07/12/24 Therapeutic Exercise: Hamstring stretch 30 sec x 2 IT band stretch 30 sec x 3  Manual Therapy: Iliacus release bilat 90 sec each side Medial glide for patella Kinesotaping for patella realignment  Neuromuscular re-ed: Myofacial ball release psoas and gluts/piriformis bilat  Pelvis realignment supine and standing 2 sec x 10  Quad set 3 sec x 10 x 2  Therapeutic Activity: SLR in ER (adjusted to sub max contraction)  Bridge 5 sec hold x 10; repeated with ball btn knees 5 sec x 10  Hip abduction in hooklying alternating LE's blue TB 3 sec x 10 R/L  Wall squat with ball btn knees 5 sec x 10  Sidelying hip adduction L LE  Self Care: Kinesotaping for patellar alignment  Ball release work    [facility] Adult PT Treatment:                                                DATE: 06/23/24 Therapeutic Exercise: Hamstring stretch 30 sec x 2 IT band stretch 30 sec x 3  Manual Therapy: Iliacus release bilat 90 sec each side Medial glide for patella Kinesotaping for patella realignment  Neuromuscular re-ed: Myofacial ball release psoas and gluts/piriformis bilat  Pelvis  realignment supine and standing 2 sec x 10  Quad set 3 sec x 10 x 2  Therapeutic Activity: SLR in ER (adjusted to sub max contraction)  Bridge 5 sec hold x 10; repeated with ball btn knees 5 sec x 10  Wall squat with ball btn knees 5 sec x 10  Sidelying hip adduction L LE  Self Care: Kinesotaping for patellar alignment  Ball release work   PATIENT EDUCATION:  Education details: POC; HEP  Person educated: Patient Education method: Programmer, Multimedia, Facilities Manager, Actor cues, Verbal cues, and Handouts Education comprehension: verbalized understanding, returned demonstration, verbal cues required, tactile cues required, and needs further education  HOME EXERCISE PROGRAM: Access Code: [email or web address] URL: [email or web address] Date: 07/12/2024 Prepared by: Kijana Estock  Exercises - Supine Quad Set  - 1 x daily - 7 x weekly - 1 sets - 10 reps - 3 sec  hold - Straight Leg Raise with External Rotation  - 1 x daily - 7 x weekly - 1 sets - 10 reps - 3-5 sec  hold - Hooklying Hamstring Stretch with Strap  - 1 x daily - 7 x weekly - 1 sets - 3 reps - 30 sec  hold - Supine ITB Stretch with Strap  - 1 x daily - 7 x weekly - 1 sets - 3 reps - 30 sec  hold - Hip Flexor Stretch at Edge of Bed  - 1 x daily - 7 x weekly - 1 sets - 3 reps - 30 sec  hold - Supine Transversus Abdominis Bracing with Pelvic Floor Contraction  - 2 x daily - 7 x weekly - 1 sets - 10 reps - 10sec  hold - Wall Quarter Squat  - 2  x daily - 7 x weekly - 1-2 sets - 10 reps - 5-10 sec  hold - Bridge  - 2 x daily - 7 x weekly - 1-2 sets - 10 reps - 5 sec  hold - Supine Bridge with Mini Swiss Ball Between Knees  - 1 x daily - 7 x weekly - 1-2 sets - 10 reps - 5 sec  hold - Wall Squat Hold with Ball  - 1 x daily - 7 x weekly - 1-2 sets - 10 reps - 5-10 sec  hold - Sit to Stand  - 1 x daily - 7 x weekly - 1 sets - 10 reps - 3-5 sec  hold - Hooklying Isometric Clamshell  - 2 x daily - 7 x weekly - 1 sets - 10 reps - 3 sec  hold -  Supine Bridge with Resistance Band  - 2 x daily - 7 x weekly - 1-2 sets - 5-10 reps - 5-10 sec  hold - Sidelying Hip Abduction  - 1 x daily - 7 x weekly - 3 sets - 10 reps - 3-5 sec  hold - Single Leg Stance with Support  - 1 x daily - 7 x weekly - 1 sets - 3 reps - 30 sec  hold  ASSESSMENT:  CLINICAL IMPRESSION: Heart ablation cancelled. Patient reports continued improvement in knee pain and popping but experienced an increase in knee pain from walking up and down steps last week. Note decreased palpable tightness through the lateral quad insertion and improved tracking of patella. She is working on exercises at home. Reviewed and progressed exercises; gradually progressing LE strengthening as patient tolerates. Kinesotaping helps to correct patellar alignment and patient will benefit from continued tape to decrease pain, improve alignment, and allow progression of exercises. Continued with myofacial ball release for psoas and gluts/piriformis.    Eval: Patient is a 53 y.o. female who was seen today for physical therapy evaluation and treatment for L knee patellofemoral pain.   OBJECTIVE IMPAIRMENTS: Abnormal gait, decreased activity tolerance, decreased balance, decreased strength, improper body mechanics, and pain.    GOALS: Goals reviewed with patient? Yes  SHORT TERM GOALS: Target date: 06/21/2024   Independent in initial HEP  Baseline: Goal status: met  2.  Patient reports ability to walk without L knee popping Baseline:  Goal status: on going   3.  Patient reports increased walking tolerance to 10 min without increased L knee pain  Baseline:  Goal status: on going    LONG TERM GOALS: Target date: 07/19/2024   Decrease L knee pain by 50-75% allowing patient to increase functional activities and exercises tolerance  Baseline:  Goal status: on going   2.  Increase strength bilat LE's to 4+/5 to 5/5  Baseline:  Goal status: on going   3.  Patient reports ability to  tolerate exercise video involving jumping with minimal to no increase in L knee pain  Baseline:  Goal status: on going   4.  Patient reports and demonstrates ability to squat and return to stand with minimal to no increase in L knee pain  Baseline:  Goal status: on going   5.  PSFS: THE PATIENT SPECIFIC FUNCTIONAL SCALE  Place score of 0-10 (0 = unable to perform activity and 10 = able to perform activity at the same level as before injury or problem)  Activity Date: 05/24/24    Walking > 5 min  6    2. Jumping  2  3. Squatting  0    4.  0    Total Score 8      Total Score = Sum of activity scores/number of activities 8/3= 2.67  Minimally Detectable Change: 3 points (for single activity); 2 points (for average score) Baseline:  Goal status: on going   6.  Independent in advanced HEP including aquatic program as indicated  Baseline:  Goal status: on going    PLAN:  PT FREQUENCY: 2x/week  PT DURATION: 8 weeks  PLANNED INTERVENTIONS: 97164- PT Re-evaluation, 97110-Therapeutic exercises, 97530- Therapeutic activity, 97112- Neuromuscular re-education, [phone]- Self Care, [id number]- Manual therapy, [phone]- Gait training, [phone]- Aquatic Therapy, Patient/Family education, Balance training, Stair training, Taping, and Joint mobilization  PLAN FOR NEXT SESSION: review and progress exercises; continue with joint conservation and ergonomic education; manual work and modalities as indicated - - trial of step up and SLS as tolerated note to MD appt 06/30/24   Kathryn Mosley Kathryn Mosley, PT 07/12/2024, 10:13 AM

## 2024-07-21 ENCOUNTER — Encounter: Payer: Self-pay | Admitting: Rehabilitative and Restorative Service Providers"

## 2024-07-21 ENCOUNTER — Ambulatory Visit: Admitting: Rehabilitative and Restorative Service Providers"

## 2024-07-21 DIAGNOSIS — M6281 Muscle weakness (generalized): Secondary | ICD-10-CM | POA: Diagnosis present

## 2024-07-21 DIAGNOSIS — M222X2 Patellofemoral disorders, left knee: Secondary | ICD-10-CM | POA: Insufficient documentation

## 2024-07-21 DIAGNOSIS — R29898 Other symptoms and signs involving the musculoskeletal system: Secondary | ICD-10-CM | POA: Insufficient documentation

## 2024-07-21 DIAGNOSIS — M25562 Pain in left knee: Secondary | ICD-10-CM | POA: Diagnosis present

## 2024-07-21 NOTE — Therapy (Signed)
 OUTPATIENT PHYSICAL THERAPY LOWER EXTREMITY TREATMENT    Patient Name: Kathryn Mosley MRN: [id number] DOB:Aug 17, 1971, 53 y.o., female Today's Date: 07/21/2024  END OF SESSION:  PT End of Session - 07/21/24 1101     Visit Number 6    Number of Visits 16    Date for Recertification  09/15/24    Authorization Type [employer] state plan no co-pay    PT Start Time 1100    PT Stop Time 1145    PT Time Calculation (min) 45 min    Activity Tolerance Patient tolerated treatment well          Past Medical History:  Diagnosis Date   Allergy 11/1972   Gluten   Anemia    Arthritis    GERD (gastroesophageal reflux disease)    Past Surgical History:  Procedure Laterality Date   CESAREAN SECTION  1994   COSMETIC SURGERY  1989   Rhinoplasty/Liposuction   Patient Active Problem List   Diagnosis Date Noted   Ehlers-Danlos disease 05/19/2024   Bilateral carpal tunnel syndrome 10/10/2022   Celiac disease 02/19/2021   DDD (degenerative disc disease), cervical 01/31/2021   Lumbar spondylosis 01/31/2021   Labral tear of hip, degenerative 01/31/2021   Folate deficiency 01/20/2020   Vitamin D deficiency 01/20/2020   GERD (gastroesophageal reflux disease) 01/20/2020   Primary osteoarthritis of left hip 10/24/2017   FH: breast cancer in first degree relative 09/22/2013   Perimenopausal 11/25/2012   H/O abnormal cervical Papanicolaou smear 08/01/2011   Hypothyroidism 04/14/2009    PCP: Dr Torrence CINDERELLA Barrier  REFERRING PROVIDER: Dr Redell DELENA Robes   REFERRING DIAG: L knee patellofemoral pain   THERAPY DIAG:  Acute pain of left knee  Patellofemoral pain syndrome of left knee  Other symptoms and signs involving the musculoskeletal system  Muscle weakness (generalized)  Rationale for Evaluation and Treatment: Rehabilitation  ONSET DATE: 04/04/24  SUBJECTIVE:   SUBJECTIVE STATEMENT: Patient reports that she had knee pain and popping is improved. She working on her exercises at home.  Trying to avoid crossing legs or ankles. Knows the hip strength will take time. The tape feels good.  She has done the exercises. Trying to walk 5000 steps a day which is a lot for her. Excited about the pelvic realignment. She was scheduled for spinal ablation but procedure was cancelled until she has further cardiac evaluation. She was seen by Dr Robes last week and he wants her to work on hip strengthening.   Eval: Patient reports that she has had L knee pain over the past six weeks. She does not remember any recent incident or accident. She did have injury to L knee while running ~ 4 yrs ago which gradually resolved. Currently having pain and popping in the L knee with walking most of the time. Pain increases with increased time walking. She has been sedentary in the past three years due to medical conditions noted below  PERTINENT HISTORY: Ehlers-Danlos, a-fib; POTS; Lyme disease; chronic LBP; lumbar spondylosis; cervical pain; spinal narrowing; ACL repair Rt x 2; torn labrum bilat hips s/p debridement ~ 7 yrs ago Rt; 6 yrs Lt; c-section 1994  PAIN:  Are you having pain? Yes: NPRS scale: 0/10 at rest; 2/10 with activity Pain location: L knee laterally will have pain on the inside of the knee with increased activity  Pain description: aching and sharp  Aggravating factors: walking > 1 block or 5 min; jumping  Relieving factors: inactivity   PRECAUTIONS: None  WEIGHT BEARING  RESTRICTIONS: No  FALLS:  Has patient fallen in last 6 months? No  LIVING ENVIRONMENT: Lives with: lives with their spouse Lives in: House/apartment Stairs: No Has following equipment at home: None  OCCUPATION: self employed free conservation officer, historic buildings; housheold chores; was walking ~ 1 hour daily and doing hiking prior to chronic medical conditions   PATIENT GOALS: stop clicking; be able to walk 1 mile   NEXT MD VISIT: 06/30/24  OBJECTIVE:  Note: Objective measures were completed at Evaluation unless otherwise  noted.  DIAGNOSTIC FINDINGS:  xrays L knee results not available   05/19/24: L knee US : Osteophytic change noted in L patellofemoral joint with small amount of synovial hypertrophy.  Possible fissuring in lateral meniscus without significant extrusion.  Hypoechogenic fluid signal in IT band as it traverses the lateral joint.   PATIENT SURVEYS:  PSFS: THE PATIENT SPECIFIC FUNCTIONAL SCALE  Place score of 0-10 (0 = unable to perform activity and 10 = able to perform activity at the same level as before injury or problem)  Activity Date: 05/24/24    Walking > 5 min  6    2. Jumping  2    3. Squatting  0    4.  0    Total Score 8      Total Score = Sum of activity scores/number of activities 8/3= 2.67  Minimally Detectable Change: 3 points (for single activity); 2 points (for average score)  [facility] (nd). The Patient Specific Functional Scale . Retrieved from [email or web address]     MUSCLE LENGTH: Hamstrings: Right 70 deg; Left 65 deg Tight hip flexors bilat  06/23/24: improved hamstring extensibility; gradual improving hip flexor extensibility but remains tight in iliopsoas R > L   POSTURE: rounded shoulders, forward head, anterior pelvic tilt, flexed trunk , and R hip elevated compared to L; stands with knees hyperextended   PALPATION: Tightness lateral distal quad at insertion to lateral superior pole of patella  Pain R > L iliacus   LOWER EXTREMITY ROM: WFL's   Active ROM Right eval Left eval  Hip flexion    Hip extension    Hip abduction    Hip adduction    Hip internal rotation    Hip external rotation    Knee flexion    Knee extension    Ankle dorsiflexion    Ankle plantarflexion    Ankle inversion    Ankle eversion     (Blank rows = not tested)  LOWER EXTREMITY MMT:  MMT Right eval Right  06/23/24 Left eval Left 06/23/24  Hip flexion 4+ 5 4- 5-  Hip extension 4 4+ 4 4+  Hip abduction  4+ 5 4- 4+  Hip adduction      Hip internal rotation      Hip external rotation      Knee flexion 5  4 5   Knee extension 5  4 5   Ankle dorsiflexion      Ankle plantarflexion      Ankle inversion      Ankle eversion       (Blank rows = not tested)  LOWER EXTREMITY SPECIAL TESTS:  Knee special tests: Patellafemoral apprehension test: positive  and Patellafemoral grind test: positive   FUNCTIONAL TESTS:  5 times sit to stand: 11.21 sec SLS R 10 sec unsteady; L 10 sec unsteady with use of UE for balance   GAIT: Distance walked: 40 feet Assistive device utilized: None Level of assistance: Complete Independence Comments: wide based gait  [facility] Adult PT Treatment:                                                DATE: 07/21/24 Therapeutic Exercise: Hamstring stretch 30 sec x 2 IT band stretch 30 sec x 3  Manual Therapy: Iliacus release bilat 90 sec each side Medial glide for patella Kinesotaping for patella realignment  Neuromuscular re-ed: Myofacial ball release psoas and gluts/piriformis bilat (HEP) Pelvis realignment supine and standing 2 sec x 10 (HEP) Quad set 3 sec x 10 x 2  SLR in slight ER 3 sec x 10  Therapeutic Activity: Bridge 5 sec hold x 10; repeated with ball btn knees 5 sec x 10  Hip abduction in hooklying alternating LE's blue TB 3 sec x 10 R/L  Clamshell red TB thigh 3 sec x 10 R/L  Wall squat with ball btn knees 5 sec x 10  Sidelying hip adduction L LE  ITB stretch sidelying 30 sec x 1 R/L  Step up 2 inch yoga block x 10 R/L - unstable on L  Sit to stand from elevated table moving slowly x 10  Self Care: Kinesotaping for patellar alignment  Ball release work   [facility] Adult PT Treatment:                                                DATE: 07/12/24 Therapeutic Exercise: Hamstring stretch 30 sec x 2 IT band stretch 30 sec x 3  Manual Therapy: Iliacus release bilat 90 sec each side Medial glide for patella Kinesotaping for patella realignment   Neuromuscular re-ed: Myofacial ball release psoas and gluts/piriformis bilat  Pelvis realignment supine and standing 2 sec x 10  Quad set 3 sec x 10 x 2  Therapeutic Activity: SLR in ER (adjusted to sub max contraction)  Bridge 5 sec hold x 10; repeated with ball btn knees 5 sec x 10  Hip abduction in hooklying alternating LE's blue TB 3 sec x 10 R/L  Wall squat with ball btn knees 5 sec x 10  Sidelying hip adduction L LE  Self Care: Kinesotaping for patellar alignment  Ball release work   PATIENT EDUCATION:  Education details: POC; HEP  Person educated: Patient Education method: Programmer, Multimedia, Facilities Manager, Actor cues, Verbal cues, and Handouts Education comprehension: verbalized understanding, returned demonstration, verbal cues required, tactile cues required, and needs further education  HOME EXERCISE PROGRAM: Access Code: [email or web address] URL: [email or web address] Date: 07/21/2024 Prepared by: Arline Ketter  Exercises - Supine Quad Set  - 1 x daily - 7 x weekly - 1 sets - 10 reps - 3 sec  hold - Straight Leg Raise with External Rotation  - 1 x daily - 7 x weekly - 1 sets - 10 reps - 3-5 sec  hold - Hooklying Hamstring Stretch with Strap  - 1 x daily - 7 x weekly - 1 sets - 3 reps - 30 sec  hold - Supine ITB Stretch with Strap  - 1 x daily - 7 x weekly - 1 sets - 3 reps - 30 sec  hold - Hip Flexor Stretch at Edge of Bed  - 1 x daily - 7 x weekly - 1 sets - 3 reps - 30 sec  hold - Supine Transversus Abdominis Bracing with Pelvic Floor Contraction  - 2 x daily - 7 x weekly - 1 sets - 10 reps - 10sec  hold - Wall Quarter Squat  - 2 x daily - 7 x weekly - 1-2 sets - 10 reps - 5-10 sec  hold - Bridge  - 2 x daily - 7 x weekly - 1-2 sets - 10 reps - 5 sec  hold - Supine Bridge with Mini Swiss Ball Between Knees  - 1 x daily - 7 x weekly - 1-2 sets - 10 reps - 5 sec  hold - Wall Squat Hold with Ball  - 1 x daily - 7 x weekly - 1-2 sets - 10 reps - 5-10 sec  hold - Sit to  Stand  - 1 x daily - 7 x weekly - 1 sets - 10 reps - 3-5 sec  hold - Hooklying Isometric Clamshell  - 2 x daily - 7 x weekly - 1 sets - 10 reps - 3 sec  hold - Supine Bridge with Resistance Band  - 2 x daily - 7 x weekly - 1-2 sets - 5-10 reps - 5-10 sec  hold - Sidelying Hip Abduction  - 1 x daily - 7 x weekly - 3 sets - 10 reps - 3-5 sec  hold - Single Leg Stance with Support  - 1 x daily - 7 x weekly - 1 sets - 3 reps - 30 sec  hold - Clam with Resistance  - 1 x daily - 7 x weekly - 1 sets - 10 reps - 3-5 sec  hold - Step Up  - 2 x daily - 7 x weekly - 1 sets - 10 reps - 2 sec  hold - Sit to Stand  - 2 x daily - 7 x weekly - 1 sets - 10 reps - 3-5 sec  hold (1 inch book for home)   ASSESSMENT:  CLINICAL IMPRESSION: Patient reports continued improvement in knee pain and popping but experienced an increase in knee pain from walking up and down steps last week. She is working on exercises at home. Reviewed and progressed exercises; gradually progressing LE strengthening as patient tolerates. Kinesotaping helps to correct patellar alignment and patient will benefit from continued tape to decrease pain, improve alignment, and allow progression of exercises. Continued with myofacial ball release for psoas and gluts/piriformis.    Eval: Patient is a 53 y.o. female who was seen today for physical therapy evaluation and treatment for L knee patellofemoral pain.   OBJECTIVE IMPAIRMENTS: Abnormal gait, decreased activity tolerance, decreased balance, decreased strength, improper body mechanics, and pain.    GOALS: Goals reviewed with patient? Yes  SHORT TERM GOALS: Target date: 06/21/2024   Independent in initial HEP  Baseline: Goal status: met  2.  Patient reports ability to walk without L knee popping Baseline:  Goal status: on going   3.  Patient reports increased walking tolerance to 10 min without increased L knee pain  Baseline:  Goal status: on going    LONG TERM GOALS: Target  date: 09/15/2024   Decrease L knee pain by 50-75% allowing patient to increase functional activities and exercises tolerance  Baseline:  Goal status: on going   2.  Increase strength bilat LE's to 4+/5 to 5/5  Baseline:  Goal status: on going   3.  Patient reports ability to tolerate exercise video involving jumping with minimal to no increase in L knee pain  Baseline:  Goal status: on going   4.  Patient reports and demonstrates ability to squat and return to stand with minimal to no increase in L knee pain  Baseline:  Goal status: on going   5.  PSFS: THE PATIENT SPECIFIC FUNCTIONAL SCALE  Place score of 0-10 (0 = unable to perform activity and 10 = able to perform activity at the same level as before injury or problem)  Activity Date: 05/24/24    Walking > 5 min  6    2. Jumping  2    3. Squatting  0    4.  0    Total Score 8      Total Score = Sum of activity scores/number of activities 8/3= 2.67  Minimally Detectable Change: 3 points (for single activity); 2 points (for average score) Baseline:  Goal status: on going   6.  Independent in advanced HEP including aquatic program as indicated  Baseline:  Goal status: on going    PLAN:  PT FREQUENCY: 2x/week  PT DURATION: 8 weeks  PLANNED INTERVENTIONS: 97164- PT Re-evaluation, 97110-Therapeutic exercises, 97530- Therapeutic activity, 97112- Neuromuscular re-education, [phone]- Self Care, [id number]- Manual therapy, [phone]- Gait training, [phone]- Aquatic Therapy, Patient/Family education, Balance training, Stair training, Taping, and Joint mobilization  PLAN FOR NEXT SESSION: review and progress exercises; continue with joint conservation and ergonomic education; manual work and modalities as indicated - - trial of step up and SLS as tolerated note to MD appt 06/30/24   Sheranda Seabrooks P Quasean Frye, PT 07/21/2024, 11:02 AM

## 2024-07-26 ENCOUNTER — Encounter: Payer: Self-pay | Admitting: Rehabilitative and Restorative Service Providers"

## 2024-07-26 ENCOUNTER — Ambulatory Visit: Admitting: Rehabilitative and Restorative Service Providers"

## 2024-07-26 DIAGNOSIS — R29898 Other symptoms and signs involving the musculoskeletal system: Secondary | ICD-10-CM

## 2024-07-26 DIAGNOSIS — M25562 Pain in left knee: Secondary | ICD-10-CM | POA: Diagnosis not present

## 2024-07-26 DIAGNOSIS — M6281 Muscle weakness (generalized): Secondary | ICD-10-CM

## 2024-07-26 DIAGNOSIS — M222X2 Patellofemoral disorders, left knee: Secondary | ICD-10-CM

## 2024-07-26 NOTE — Therapy (Signed)
 OUTPATIENT PHYSICAL THERAPY LOWER EXTREMITY TREATMENT    Patient Name: Kathryn Mosley MRN: [id number] DOB:1971-04-21, 53 y.o., female Today's Date: 07/26/2024  END OF SESSION:  PT End of Session - 07/26/24 1031     Visit Number 7    Number of Visits 16    Date for Recertification  09/15/24    Authorization Type [employer] state plan no co-pay    PT Start Time 1020    PT Stop Time 1105    PT Time Calculation (min) 45 min    Activity Tolerance Patient tolerated treatment well          Past Medical History:  Diagnosis Date   Allergy 11/1972   Gluten   Anemia    Arthritis    GERD (gastroesophageal reflux disease)    Past Surgical History:  Procedure Laterality Date   CESAREAN SECTION  1994   COSMETIC SURGERY  1989   Rhinoplasty/Liposuction   Patient Active Problem List   Diagnosis Date Noted   Ehlers-Danlos disease 05/19/2024   Bilateral carpal tunnel syndrome 10/10/2022   Celiac disease 02/19/2021   DDD (degenerative disc disease), cervical 01/31/2021   Lumbar spondylosis 01/31/2021   Labral tear of hip, degenerative 01/31/2021   Folate deficiency 01/20/2020   Vitamin D deficiency 01/20/2020   GERD (gastroesophageal reflux disease) 01/20/2020   Primary osteoarthritis of left hip 10/24/2017   FH: breast cancer in first degree relative 09/22/2013   Perimenopausal 11/25/2012   H/O abnormal cervical Papanicolaou smear 08/01/2011   Hypothyroidism 04/14/2009    PCP: Dr Torrence CINDERELLA Barrier  REFERRING PROVIDER: Dr Redell DELENA Robes   REFERRING DIAG: L knee patellofemoral pain   THERAPY DIAG:  Acute pain of left knee  Patellofemoral pain syndrome of left knee  Other symptoms and signs involving the musculoskeletal system  Muscle weakness (generalized)  Rationale for Evaluation and Treatment: Rehabilitation  ONSET DATE: 04/04/24  SUBJECTIVE:   SUBJECTIVE STATEMENT: Patient reports that symptoms in general ar worse in the past few days. She feels the knee pain and  popping continues improve. She working on her exercises at home. Trying to avoid crossing legs or ankles. Knows the hip strength will take time. The tape feels good.  She has done the exercises. Trying to walk 5000 or more steps a day, some days 6500 steps. Excited about the pelvic realignment.   She was scheduled for spinal ablation but procedure was cancelled until she has further cardiac evaluation.  Eval: Patient reports that she has had L knee pain over the past six weeks. She does not remember any recent incident or accident. She did have injury to L knee while running ~ 4 yrs ago which gradually resolved. Currently having pain and popping in the L knee with walking most of the time. Pain increases with increased time walking. She has been sedentary in the past three years due to medical conditions noted below  PERTINENT HISTORY: Ehlers-Danlos, a-fib; POTS; Lyme disease; chronic LBP; lumbar spondylosis; cervical pain; spinal narrowing; ACL repair Rt x 2; torn labrum bilat hips s/p debridement ~ 7 yrs ago Rt; 6 yrs Lt; c-section 1994  PAIN:  Are you having pain? Yes: NPRS scale: 0/10 at rest; 2/10 with activity Pain location: L knee laterally will have pain on the inside of the knee with increased activity  Pain description: aching and sharp  Aggravating factors: walking > 1 block or 5 min; jumping  Relieving factors: inactivity   PRECAUTIONS: None  WEIGHT BEARING RESTRICTIONS: No  FALLS:  Has patient fallen in last 6 months? No  LIVING ENVIRONMENT: Lives with: lives with their spouse Lives in: House/apartment Stairs: No Has following equipment at home: None  OCCUPATION: self employed free conservation officer, historic buildings; housheold chores; was walking ~ 1 hour daily and doing hiking prior to chronic medical conditions   PATIENT GOALS: stop clicking; be able to walk 1 mile   NEXT MD VISIT: 06/30/24  OBJECTIVE:  Note: Objective measures were completed at Evaluation unless otherwise  noted.  DIAGNOSTIC FINDINGS:  xrays L knee results not available   05/19/24: L knee US : Osteophytic change noted in L patellofemoral joint with small amount of synovial hypertrophy.  Possible fissuring in lateral meniscus without significant extrusion.  Hypoechogenic fluid signal in IT band as it traverses the lateral joint.   PATIENT SURVEYS:  PSFS: THE PATIENT SPECIFIC FUNCTIONAL SCALE  Place score of 0-10 (0 = unable to perform activity and 10 = able to perform activity at the same level as before injury or problem)  Activity Date: 05/24/24    Walking > 5 min  6    2. Jumping  2    3. Squatting  0    4.  0    Total Score 8      Total Score = Sum of activity scores/number of activities 8/3= 2.67  Minimally Detectable Change: 3 points (for single activity); 2 points (for average score)  [facility] (nd). The Patient Specific Functional Scale . Retrieved from [email or web address]     MUSCLE LENGTH: Hamstrings: Right 70 deg; Left 65 deg Tight hip flexors bilat  06/23/24: improved hamstring extensibility; gradual improving hip flexor extensibility but remains tight in iliopsoas R > L   POSTURE: rounded shoulders, forward head, anterior pelvic tilt, flexed trunk , and R hip elevated compared to L; stands with knees hyperextended   PALPATION: Tightness lateral distal quad at insertion to lateral superior pole of patella  Pain R > L iliacus   LOWER EXTREMITY ROM: WFL's   Active ROM Right eval Left eval  Hip flexion    Hip extension    Hip abduction    Hip adduction    Hip internal rotation    Hip external rotation    Knee flexion    Knee extension    Ankle dorsiflexion    Ankle plantarflexion    Ankle inversion    Ankle eversion     (Blank rows = not tested)  LOWER EXTREMITY MMT:  MMT Right eval Right  06/23/24 Left eval Left 06/23/24  Hip flexion 4+ 5 4- 5-  Hip extension 4 4+ 4 4+  Hip abduction  4+ 5 4- 4+  Hip adduction      Hip internal rotation      Hip external rotation      Knee flexion 5  4 5   Knee extension 5  4 5   Ankle dorsiflexion      Ankle plantarflexion      Ankle inversion      Ankle eversion       (Blank rows = not tested)  LOWER EXTREMITY SPECIAL TESTS:  Knee special tests: Patellafemoral apprehension test: positive  and Patellafemoral grind test: positive   FUNCTIONAL TESTS:  5 times sit to stand: 11.21 sec SLS R 10 sec unsteady; L 10 sec unsteady with use of UE for balance   GAIT: Distance walked: 40 feet Assistive device utilized: None Level of assistance: Complete Independence Comments: wide based gait   [facility] Adult PT Treatment:  DATE: 07/26/24 Therapeutic Exercise: Hamstring stretch 30 sec x 2 IT band stretch 30 sec x 3  Manual Therapy: Medial glide for patella Kinesotaping for patella realignment  Neuromuscular re-ed: Myofacial ball release psoas and gluts/piriformis bilat (HEP) Pelvis realignment supine and standing 2 sec x 10 (HEP) Quad set 3 sec x 10 x 2  SLR in slight ER 3 sec x 10  SLS R/L ~ 10 sec x 10 R/L  Therapeutic Activity: Bridge green TB 5 sec hold x 10; repeated with ball btn knees 5 sec x 10  Hip abduction in hooklying alternating LE's green (increase to blue) TB 3 sec x 10 R/L  Clamshell green TB thigh 3 sec x 10 R/L  Reverse clam shell green TB 3 sec x 10 R/L Wall squat with ball btn knees 5 sec x 10  Sidelying hip adduction L LE  ITB stretch sidelying 30 sec x 1 R/L  Step up 2 inch step x 10 x 2 R/L - fingertips to assist with balance on L  Sit to stand from elevated table moving slowly x 10  Side steps 8 feet x 5 R/L  Tandem walking and backward walking 8 feet x 5 each  Self Care: Kinesotaping for patellar alignment  Ball release work    [facility] Adult PT Treatment:                                                DATE: 07/21/24 Therapeutic Exercise: Hamstring stretch 30  sec x 2 IT band stretch 30 sec x 3  Manual Therapy: Iliacus release bilat 90 sec each side Medial glide for patella Kinesotaping for patella realignment  Neuromuscular re-ed: Myofacial ball release psoas and gluts/piriformis bilat (HEP) Pelvis realignment supine and standing 2 sec x 10 (HEP) Quad set 3 sec x 10 x 2  SLR in slight ER 3 sec x 10  Therapeutic Activity: Bridge 5 sec hold x 10; repeated with ball btn knees 5 sec x 10  Hip abduction in hooklying alternating LE's blue TB 3 sec x 10 R/L  Clamshell red TB thigh 3 sec x 10 R/L  Wall squat with ball btn knees 5 sec x 10  Sidelying hip adduction L LE  ITB stretch sidelying 30 sec x 1 R/L  Step up 2 inch yoga block x 10 R/L - unstable on L  Sit to stand from elevated table moving slowly x 10  Self Care: Kinesotaping for patellar alignment  Ball release work    PATIENT EDUCATION:  Education details: POC; HEP  Person educated: Patient Education method: Programmer, Multimedia, Facilities Manager, Actor cues, Verbal cues, and Handouts Education comprehension: verbalized understanding, returned demonstration, verbal cues required, tactile cues required, and needs further education  HOME EXERCISE PROGRAM: Access Code: [email or web address] URL: [email or web address] Date: 07/26/2024 Prepared by: Copelan Maultsby  Exercises - Supine Quad Set  - 1 x daily - 7 x weekly - 1 sets - 10 reps - 3 sec  hold - Straight Leg Raise with External Rotation  - 1 x daily - 7 x weekly - 1 sets - 10 reps - 3-5 sec  hold - Hooklying Hamstring Stretch with Strap  - 1 x daily - 7 x weekly - 1 sets - 3 reps - 30 sec  hold - Supine ITB Stretch with Strap  - 1 x daily - 7 x weekly -  1 sets - 3 reps - 30 sec  hold - Hip Flexor Stretch at Edge of Bed  - 1 x daily - 7 x weekly - 1 sets - 3 reps - 30 sec  hold - Supine Transversus Abdominis Bracing with Pelvic Floor Contraction  - 2 x daily - 7 x weekly - 1 sets - 10 reps - 10sec  hold - Wall Quarter Squat  - 2 x daily -  7 x weekly - 1-2 sets - 10 reps - 5-10 sec  hold - Bridge  - 2 x daily - 7 x weekly - 1-2 sets - 10 reps - 5 sec  hold - Supine Bridge with Mini Swiss Ball Between Knees  - 1 x daily - 7 x weekly - 1-2 sets - 10 reps - 5 sec  hold - Wall Squat Hold with Ball  - 1 x daily - 7 x weekly - 1-2 sets - 10 reps - 5-10 sec  hold - Sit to Stand  - 1 x daily - 7 x weekly - 1 sets - 10 reps - 3-5 sec  hold - Hooklying Isometric Clamshell  - 2 x daily - 7 x weekly - 1 sets - 10 reps - 3 sec  hold - Supine Bridge with Resistance Band  - 2 x daily - 7 x weekly - 1-2 sets - 5-10 reps - 5-10 sec  hold - Sidelying Hip Abduction  - 1 x daily - 7 x weekly - 3 sets - 10 reps - 3-5 sec  hold - Single Leg Stance with Support  - 1 x daily - 7 x weekly - 1 sets - 3 reps - 30 sec  hold - Clam with Resistance  - 1 x daily - 7 x weekly - 1 sets - 10 reps - 3-5 sec  hold - Step Up  - 2 x daily - 7 x weekly - 1 sets - 10 reps - 2 sec  hold - Sit to Stand  - 2 x daily - 7 x weekly - 1 sets - 10 reps - 3-5 sec  hold - Side Stepping with Resistance at Thighs  - 1 x daily - 7 x weekly - Tandem Walking  - 1 x daily - 7 x weekly - 1 sets - 3 reps - 30 sec  hold - Backwards Walking  - 1 x daily - 7 x weekly - 1 sets - 3 reps - 30 sec  hold  ASSESSMENT:  CLINICAL IMPRESSION: Patient reports continued improvement in knee pain and popping but experienced an increase in knee pain from walking up and down steps. She is working on exercises at home. Reviewed and progressed exercises; gradually progressing LE strengthening as patient tolerates. Kinesotaping helps to correct patellar alignment and patient will benefit from continued tape to decrease pain, improve alignment, and allow progression of exercises. Continued with myofacial ball release for psoas and gluts/piriformis at home; focus on strengthening during today's treatment.    Eval: Patient is a 53 y.o. female who was seen today for physical therapy evaluation and treatment for  L knee patellofemoral pain.   OBJECTIVE IMPAIRMENTS: Abnormal gait, decreased activity tolerance, decreased balance, decreased strength, improper body mechanics, and pain.    GOALS: Goals reviewed with patient? Yes  SHORT TERM GOALS: Target date: 06/21/2024   Independent in initial HEP  Baseline: Goal status: met  2.  Patient reports ability to walk without L knee popping Baseline:  Goal status: on going  3.  Patient reports increased walking tolerance to 10 min without increased L knee pain  Baseline:  Goal status: on going    LONG TERM GOALS: Target date: 09/15/2024   Decrease L knee pain by 50-75% allowing patient to increase functional activities and exercises tolerance  Baseline:  Goal status: on going   2.  Increase strength bilat LE's to 4+/5 to 5/5  Baseline:  Goal status: on going   3.  Patient reports ability to tolerate exercise video involving jumping with minimal to no increase in L knee pain  Baseline:  Goal status: on going   4.  Patient reports and demonstrates ability to squat and return to stand with minimal to no increase in L knee pain  Baseline:  Goal status: on going   5.  PSFS: THE PATIENT SPECIFIC FUNCTIONAL SCALE  Place score of 0-10 (0 = unable to perform activity and 10 = able to perform activity at the same level as before injury or problem)  Activity Date: 05/24/24    Walking > 5 min  6    2. Jumping  2    3. Squatting  0    4.  0    Total Score 8      Total Score = Sum of activity scores/number of activities 8/3= 2.67  Minimally Detectable Change: 3 points (for single activity); 2 points (for average score) Baseline:  Goal status: on going   6.  Independent in advanced HEP including aquatic program as indicated  Baseline:  Goal status: on going    PLAN:  PT FREQUENCY: 2x/week  PT DURATION: 8 weeks  PLANNED INTERVENTIONS: 97164- PT Re-evaluation, 97110-Therapeutic exercises, 97530- Therapeutic activity, 97112-  Neuromuscular re-education, 97535- Self Care, [id number]- Manual therapy, [phone]- Gait training, [phone]- Aquatic Therapy, Patient/Family education, Balance training, Stair training, Taping, and Joint mobilization  PLAN FOR NEXT SESSION: review and progress exercises; continue with joint conservation and ergonomic education; manual work and modalities as indicated - - continue with functional strengthening as tolerated including mini step up and SLS   Latesa Fratto P Vesna Kable, PT 07/26/2024, 10:32 AM

## 2024-08-02 ENCOUNTER — Encounter: Payer: Self-pay | Admitting: Rehabilitative and Restorative Service Providers"

## 2024-08-02 ENCOUNTER — Ambulatory Visit: Admitting: Rehabilitative and Restorative Service Providers"

## 2024-08-02 DIAGNOSIS — M6281 Muscle weakness (generalized): Secondary | ICD-10-CM

## 2024-08-02 DIAGNOSIS — M25562 Pain in left knee: Secondary | ICD-10-CM | POA: Diagnosis not present

## 2024-08-02 DIAGNOSIS — M222X2 Patellofemoral disorders, left knee: Secondary | ICD-10-CM

## 2024-08-02 DIAGNOSIS — R29898 Other symptoms and signs involving the musculoskeletal system: Secondary | ICD-10-CM

## 2024-08-02 NOTE — Therapy (Signed)
 OUTPATIENT PHYSICAL THERAPY LOWER EXTREMITY TREATMENT    Patient Name: Kathryn Mosley MRN: [id number] DOB:[birth year], 53 y.o., female Today's Date: 08/02/2024  END OF SESSION:  PT End of Session - 08/02/24 1016     Visit Number 8    Number of Visits 16    Date for Recertification  09/15/24    Authorization Type [employer] state plan no co-pay    PT Start Time 1015    PT Stop Time 1100    PT Time Calculation (min) 45 min    Activity Tolerance Patient tolerated treatment well          Past Medical History:  Diagnosis Date   Allergy 11/1972   Gluten   Anemia    Arthritis    GERD (gastroesophageal reflux disease)    Past Surgical History:  Procedure Laterality Date   CESAREAN SECTION  1994   COSMETIC SURGERY  1989   Rhinoplasty/Liposuction   Patient Active Problem List   Diagnosis Date Noted   Ehlers-Danlos disease 05/19/2024   Bilateral carpal tunnel syndrome 10/10/2022   Celiac disease 02/19/2021   DDD (degenerative disc disease), cervical 01/31/2021   Lumbar spondylosis 01/31/2021   Labral tear of hip, degenerative 01/31/2021   Folate deficiency 01/20/2020   Vitamin D deficiency 01/20/2020   GERD (gastroesophageal reflux disease) 01/20/2020   Primary osteoarthritis of left hip 10/24/2017   FH: breast cancer in first degree relative 09/22/2013   Perimenopausal 11/25/2012   H/O abnormal cervical Papanicolaou smear 08/01/2011   Hypothyroidism 04/14/2009    PCP: Dr Torrence CINDERELLA Barrier  REFERRING PROVIDER: Dr Redell DELENA Robes   REFERRING DIAG: L knee patellofemoral pain   THERAPY DIAG:  Acute pain of left knee  Patellofemoral pain syndrome of left knee  Other symptoms and signs involving the musculoskeletal system  Muscle weakness (generalized)  Rationale for Evaluation and Treatment: Rehabilitation  ONSET DATE: 04/04/24  SUBJECTIVE:   SUBJECTIVE STATEMENT: Patient reports that symptoms in general are in improving. She is working on her exercises. She is  having much less clicking in L knee. She is walking with greater ease and feels her core is getting stronger.  She working on her exercises at home. Trying to avoid crossing legs or ankles. Knows the hip strength will take time. The tape feels good.  She has done the exercises. Trying to walk 5000 or more steps a day, some days 6500 steps. Excited about the pelvic realignment.   She was scheduled for spinal ablation but procedure was cancelled until she has further cardiac evaluation.  Eval: Patient reports that she has had L knee pain over the past six weeks. She does not remember any recent incident or accident. She did have injury to L knee while running ~ 4 yrs ago which gradually resolved. Currently having pain and popping in the L knee with walking most of the time. Pain increases with increased time walking. She has been sedentary in the past three years due to medical conditions noted below  PERTINENT HISTORY: Ehlers-Danlos, a-fib; POTS; Lyme disease; chronic LBP; lumbar spondylosis; cervical pain; spinal narrowing; ACL repair Rt x 2; torn labrum bilat hips s/p debridement ~ 7 yrs ago Rt; 6 yrs Lt; c-section 1994  PAIN:  Are you having pain? Yes: NPRS scale: 4/10 at rest; 4/10 with activity Pain location: L knee laterally will have pain on the inside of the knee with increased activity  Pain description: aching and sharp  Aggravating factors: walking > 1 block or 5 min; jumping  Relieving factors: inactivity   PRECAUTIONS: None  WEIGHT BEARING RESTRICTIONS: No  FALLS:  Has patient fallen in last 6 months? No  LIVING ENVIRONMENT: Lives with: lives with their spouse Lives in: House/apartment Stairs: No Has following equipment at home: None  OCCUPATION: self employed free conservation officer, historic buildings; housheold chores; was walking ~ 1 hour daily and doing hiking prior to chronic medical conditions   PATIENT GOALS: stop clicking; be able to walk 1 mile   NEXT MD VISIT: 08/02/24  OBJECTIVE:   Note: Objective measures were completed at Evaluation unless otherwise noted.  DIAGNOSTIC FINDINGS:  xrays L knee results not available   05/19/24: L knee US : Osteophytic change noted in L patellofemoral joint with small amount of synovial hypertrophy.  Possible fissuring in lateral meniscus without significant extrusion.  Hypoechogenic fluid signal in IT band as it traverses the lateral joint.   PATIENT SURVEYS:  PSFS: THE PATIENT SPECIFIC FUNCTIONAL SCALE  Place score of 0-10 (0 = unable to perform activity and 10 = able to perform activity at the same level as before injury or problem)  Activity Date: 05/24/24 Date: 08/02/24   Walking > 5 min  6 10   2. Jumping  2 3   3. Squatting  0 2   4.  0    Total Score 8 15     Total Score = Sum of activity scores/number of activities 8/3= 2.67 08/02/24 - 15/3 = 5  Minimally Detectable Change: 3 points (for single activity); 2 points (for average score)  [facility] (nd). The Patient Specific Functional Scale . Retrieved from [email or web address]     MUSCLE LENGTH: Hamstrings: Right 70 deg; Left 65 deg Tight hip flexors bilat  06/23/24: improved hamstring extensibility; gradual improving hip flexor extensibility but remains tight in iliopsoas R > L  08/02/24: hamstring flexibility WNL's bilat; hip flexors significantly improved slightly tighter R > L   POSTURE: rounded shoulders, forward head, anterior pelvic tilt, flexed trunk , and R hip elevated compared to L; stands with knees hyperextended   PALPATION: Tightness lateral distal quad at insertion to lateral superior pole of patella  Pain R > L iliacus  08/02/24: no significant tightness at L patella  Minimal tightness remaining at R > L iliacus   LOWER EXTREMITY ROM: WFL's   Active ROM Right eval Left eval  Hip flexion    Hip extension    Hip abduction    Hip adduction    Hip internal rotation    Hip  external rotation    Knee flexion    Knee extension    Ankle dorsiflexion    Ankle plantarflexion    Ankle inversion    Ankle eversion     (Blank rows = not tested)  LOWER EXTREMITY MMT:  MMT Right eval Right  06/23/24 Right  08/02/24 Left eval Left 06/23/24 Left  08/02/24  Hip flexion 4+ 5 5 4- 5- 5  Hip extension 4 4+ 5- 4 4+ 5-  Hip abduction 4+ 5 5 4- 4+ 5-  Hip adduction        Hip internal rotation        Hip external rotation        Knee flexion 5 5  4 5 5   Knee extension 5 5  4 5 5   Ankle dorsiflexion        Ankle plantarflexion        Ankle inversion        Ankle eversion         (  Blank rows = not tested)  LOWER EXTREMITY SPECIAL TESTS:  Knee special tests: Patellafemoral apprehension test: positive  and Patellafemoral grind test: positive   FUNCTIONAL TESTS:  5 times sit to stand: 11.21 sec SLS R 10 sec unsteady; L 10 sec unsteady with use of UE for balance  08/02/24:  5 times sit to stand; 11.3 sec no pain  SLS R 10 sec improved stability; L 10 sec improved stability no use of UE's   GAIT: Distance walked: 40 feet Assistive device utilized: None Level of assistance: Complete Independence Comments: wide based gait   [facility] Adult PT Treatment:                                                DATE: 08/02/24 Therapeutic Exercise: Hamstring stretch 30 sec x 2 IT band stretch 30 sec x 3  Manual Therapy: Medial glide for patella Kinesotaping for patella realignment  Neuromuscular re-ed: Myofacial ball release psoas and gluts/piriformis bilat (HEP) Pelvis realignment supine and standing 2 sec x 10 (HEP) Quad set 3 sec x 10 x 2  SLR in slight ER 3 sec x 10  SLS R/L ~ 10 sec x 10 R/L  Therapeutic Activity: Bridge blue TB 5 sec hold x 10; repeated with ball btn knees 5 sec x 10  Hip abduction in hooklying alternating LE's  blue TB 3 sec x 10 R/L  Clamshell green TB thigh 3 sec x 10 R/L  Reverse clam shell green TB 3 sec x 10 R/L Wall squat with ball btn  knees 5 sec x 10  Sidelying hip adduction L LE  ITB stretch sidelying 30 sec x 1 R/L  Step up 2 inch step x 10 x 2 R/L - fingertips to assist with balance on L  Sit to stand from elevated table moving slowly x 10  Side steps green TB distal thigh 8 feet x 5 R/L  Tandem walking and backward walking 8 feet x 5 each  Self Care: Kinesotaping for patellar alignment  Ball release work    [facility] Adult PT Treatment:                                                DATE: 07/26/24 Therapeutic Exercise: Hamstring stretch 30 sec x 2 IT band stretch 30 sec x 3  Manual Therapy: Medial glide for patella Kinesotaping for patella realignment  Neuromuscular re-ed: Myofacial ball release psoas and gluts/piriformis bilat (HEP) Pelvis realignment supine and standing 2 sec x 10 (HEP) Quad set 3 sec x 10 x 2  SLR in slight ER 3 sec x 10  SLS R/L ~ 10 sec x 10 R/L  Therapeutic Activity: Bridge green TB 5 sec hold x 10; repeated with ball btn knees 5 sec x 10  Hip abduction in hooklying alternating LE's green (increase to blue) TB 3 sec x 10 R/L  Clamshell green TB thigh 3 sec x 10 R/L  Reverse clam shell green TB 3 sec x 10 R/L Wall squat with ball btn knees 5 sec x 10  Sidelying hip adduction L LE  ITB stretch sidelying 30 sec x 1 R/L  Step up 2 inch step x 10 x 2 R/L - fingertips to assist  with balance on L  Sit to stand from elevated table moving slowly x 10  Side steps 8 feet x 5 R/L  Tandem walking and backward walking 8 feet x 5 each  Self Care: Kinesotaping for patellar alignment  Ball release work    PATIENT EDUCATION:  Education details: POC; HEP  Person educated: Patient Education method: Programmer, Multimedia, Facilities Manager, Actor cues, Verbal cues, and Handouts Education comprehension: verbalized understanding, returned demonstration, verbal cues required, tactile cues required, and needs further education  HOME EXERCISE PROGRAM: Access Code: [email or web address] URL:  [email or web address] Date: 08/02/2024 Prepared by: Bridey Brookover  Exercises - Supine Quad Set  - 1 x daily - 7 x weekly - 1 sets - 10 reps - 3 sec  hold - Straight Leg Raise with External Rotation  - 1 x daily - 7 x weekly - 1 sets - 10 reps - 3-5 sec  hold - Hooklying Hamstring Stretch with Strap  - 1 x daily - 7 x weekly - 1 sets - 3 reps - 30 sec  hold - Supine ITB Stretch with Strap  - 1 x daily - 7 x weekly - 1 sets - 3 reps - 30 sec  hold - Hip Flexor Stretch at Edge of Bed  - 1 x daily - 7 x weekly - 1 sets - 3 reps - 30 sec  hold - Supine Transversus Abdominis Bracing with Pelvic Floor Contraction  - 2 x daily - 7 x weekly - 1 sets - 10 reps - 10sec  hold - Wall Quarter Squat  - 2 x daily - 7 x weekly - 1-2 sets - 10 reps - 5-10 sec  hold - Bridge  - 2 x daily - 7 x weekly - 1-2 sets - 10 reps - 5 sec  hold - Supine Bridge with Mini Swiss Ball Between Knees  - 1 x daily - 7 x weekly - 1-2 sets - 10 reps - 5 sec  hold - Wall Squat Hold with Ball  - 1 x daily - 7 x weekly - 1-2 sets - 10 reps - 5-10 sec  hold - Sit to Stand  - 1 x daily - 7 x weekly - 1 sets - 10 reps - 3-5 sec  hold - Hooklying Isometric Clamshell  - 2 x daily - 7 x weekly - 1 sets - 10 reps - 3 sec  hold - Supine Bridge with Resistance Band  - 2 x daily - 7 x weekly - 1-2 sets - 5-10 reps - 5-10 sec  hold - Sidelying Hip Abduction  - 1 x daily - 7 x weekly - 3 sets - 10 reps - 3-5 sec  hold - Single Leg Stance with Support  - 1 x daily - 7 x weekly - 1 sets - 3 reps - 30 sec  hold - Clam with Resistance  - 1 x daily - 7 x weekly - 1 sets - 10 reps - 3-5 sec  hold - Step Up  - 2 x daily - 7 x weekly - 1 sets - 10 reps - 2 sec  hold - Sit to Stand  - 2 x daily - 7 x weekly - 1 sets - 10 reps - 3-5 sec  hold - Side Stepping with Resistance at Thighs  - 1 x daily - 7 x weekly - Tandem Walking  - 1 x daily - 7 x weekly - 1 sets - 3 reps - 30 sec  hold - Backwards Walking  -  1 x daily - 7 x weekly - 1 sets - 3  reps - 30 sec  hold - Plank on Counter  - 1 x daily - 7 x weekly - 1 sets - 3 reps - 30 sec  hold  ASSESSMENT:  CLINICAL IMPRESSION: Patient reports continued improvement in knee pain and popping and decreased pain. She feels she is gaining strength in hips and core. Strength assessment demonstrates gains in strength bilat LE's. She has progressed to work on functional strengthening in weight bearing. She continues to experience increased L knee pain from walking up and down steps. She is working on exercises at home. Reviewed and progressed exercises; gradually progressing LE strengthening as patient tolerates. Kinesotaping helps to correct patellar alignment and patient will benefit from continued tape to decrease pain, improve alignment, and allow progression of exercises. Continued with myofacial ball release for psoas and gluts/piriformis at home; focus on strengthening during today's treatment. Will benefit from continued therapy to gradually progress strengthening program.    Eval: Patient is a 53 y.o. female who was seen today for physical therapy evaluation and treatment for L knee patellofemoral pain.   OBJECTIVE IMPAIRMENTS: Abnormal gait, decreased activity tolerance, decreased balance, decreased strength, improper body mechanics, and pain.    GOALS: Goals reviewed with patient? Yes  SHORT TERM GOALS: Target date: 06/21/2024   Independent in initial HEP  Baseline: Goal status: met  2.  Patient reports ability to walk without L knee popping Baseline:  Goal status: on going   3.  Patient reports increased walking tolerance to 10 min without increased L knee pain  Baseline:  Goal status: on going    LONG TERM GOALS: Target date: 09/15/2024   Decrease L knee pain by 50-75% allowing patient to increase functional activities and exercises tolerance  Baseline:  Goal status: on going   2.  Increase strength bilat LE's to 4+/5 to 5/5  Baseline:  Goal status: on going    3.  Patient reports ability to tolerate exercise video involving jumping with minimal to no increase in L knee pain  Baseline:  Goal status: on going   4.  Patient reports and demonstrates ability to squat and return to stand with minimal to no increase in L knee pain  Baseline:  Goal status: on going   5.  PSFS: THE PATIENT SPECIFIC FUNCTIONAL SCALE  Place score of 0-10 (0 = unable to perform activity and 10 = able to perform activity at the same level as before injury or problem)  Activity Date: 05/24/24    Walking > 5 min  6 10   2. Jumping  2 3   3. Squatting  0 2   4.  0    Total Score 8 10     Total Score = Sum of activity scores/number of activities 8/3= 2.67 08/02/24: 15/3 = 5   Minimally Detectable Change: 3 points (for single activity); 2 points (for average score) Baseline:  Goal status: on going   6.  Independent in advanced HEP including aquatic program as indicated  Baseline:  Goal status: on going    PLAN:  PT FREQUENCY: 2x/week  PT DURATION: 8 weeks  PLANNED INTERVENTIONS: 97164- PT Re-evaluation, 97110-Therapeutic exercises, 97530- Therapeutic activity, 97112- Neuromuscular re-education, 97535- Self Care, [id number]- Manual therapy, [phone]- Gait training, [phone]- Aquatic Therapy, Patient/Family education, Balance training, Stair training, Taping, and Joint mobilization  PLAN FOR NEXT SESSION: review and progress exercises; continue with joint conservation and ergonomic education; manual work  and modalities as indicated - - continue with functional strengthening as tolerated including mini step up and SLS   Kynlie Jane SHAUNNA Baptist, PT 08/02/2024, 10:16 AM

## 2024-08-04 ENCOUNTER — Ambulatory Visit

## 2024-08-04 VITALS — BP 118/82 | Ht 65.0 in | Wt 138.0 lb

## 2024-08-04 DIAGNOSIS — Q796 Ehlers-Danlos syndrome, unspecified: Secondary | ICD-10-CM | POA: Diagnosis not present

## 2024-08-04 DIAGNOSIS — M67262 Synovial hypertrophy, not elsewhere classified, left lower leg: Secondary | ICD-10-CM | POA: Diagnosis not present

## 2024-08-04 DIAGNOSIS — M25562 Pain in left knee: Secondary | ICD-10-CM | POA: Diagnosis not present

## 2024-08-04 DIAGNOSIS — G8929 Other chronic pain: Secondary | ICD-10-CM | POA: Diagnosis not present

## 2024-08-04 NOTE — Progress Notes (Signed)
 Subjective:    Patient ID: Kathryn Mosley, female    DOB: 53 y.o., 02-Feb-1971   MRN: [id number]  Chief Complaint: EDS and Left knee pain (ITB syndrome vs meniscus tear) follow up  Discussed the use of AI scribe software for clinical note transcription with the patient, who gave verbal consent to proceed.  History of Present Illness Kathryn Mosley is a 53 year old female with chronic hip and back pain who presents for follow-up on physical therapy progress and pain management.  Hip and back pain - Chronic hip and back pain present for over ten years. - Pain worsens with physical therapy exercises, especially externally rotated leg lifts, resulting in a shift in the hip joint. - Experiences a 'popping' sensation in the back during activities such as using the bathroom or abrupt position changes. - Hip pain is a continuation of previous issues, with increased intensity following physical therapy exercises. - No recent imaging of the hips since relocation; last back imaging performed three years ago.  Physical therapy response - Physical therapy has improved knee condition, reducing frequency of clicking. - Exercises targeting hips and back have increased pain levels. - Improvement in core strength and posture has positively impacted walking ability.  Functional status and goals - Aims to improve strength to prevent future debilitation. - Significant time spent bedridden in the past three years due to pain.  Medical history and medications - History of Ehlers-Danlos syndrome. - Continues to take Lyrica  for pain management.  Normal laboratory evaluation ruling out concomitant inflammatory cause of her hypermobility. Echocardiogram normal on 06/21/2024 ruling out aortic root dilation. Left knee pain has been chronic for her and on 05/19/2024 there was potential evidence of IT band irritation, hypertrophied synovium, and a questionable meniscus tear on ultrasound.  Planned for obtaining MRI for if  pain not improving with PT.     Objective:   Vitals:   08/04/24 1039  BP: 118/82   Bilateral hips: Reproducible snapping present over the bilateral greater trochanters with active flexion/extension of the hip with an extended knee.  Mild TTP over bilateral greater trochanters.     Assessment & Plan:   Assessment & Plan External snapping hip syndrome   Chronic bilateral hip pain with external snapping hip syndrome is exacerbated by some of her physical therapy exercises. Pain aligns with previous imaging from over three years ago, with no recent imaging conducted. Although exercises increase pain prominence, there is no overall worsening. The IT band snapping over the greater trochanter characterizes the external snapping hip syndrome. Continue physical therapy focusing on gluteal strengthening to stabilize the hip and prevent snapping. Communicate with the physical therapist to target gluteal muscles more specifically. Consider dry needling if feasible, despite insurance limitations. Reassess the need for imaging if pain persists despite physical therapy.  Chronic back pain due to Ehlers-Danlos syndrome   exacerbated by physical therapy exercises. Pain is more prominent due to exercises but is not worsening overall. Core strengthening has improved posture and walking ability. Continue physical therapy with a focus on core strengthening. Consider imaging if pain persists despite physical therapy.  Chronic left knee pain due to patellofemoral tracking dysfunction   shows improvement with physical therapy, reducing the frequency of knee clicking. Pain is managed with exercises to maintain proper tracking. Continue physical therapy focusing on quadriceps strengthening to maintain patellar tracking.  Ehlers-Danlos syndrome   contributes to chronic pain and dysfunction in hips, back, and knee. The patient reports a recent significant shift in  the back and increased pain with physical therapy  exercises. Discussed potential use of low dose naltrexone for pain management, but decision deferred until after addressing hip issues more specifically. Low dose naltrexone may modulate pain pathways and boost immune function, with a low risk of side effects. Consider low dose naltrexone for pain management if hip issues persist after targeted physical therapy.    Greater than 30 minutes was spent in face-to-face interaction with the patient and her husband discussing progress since my last visit with them, evaluating her for bilateral external snapping hip syndrome, reviewing prior notes, and discussing treatment options going forward.

## 2024-08-04 NOTE — Nursing Note (Signed)
 Pt treated for Dsyautonomia with 1K mL NS. Pt states that NS infusions help her generalized pain and increases her energy. Pt tolerated treatment and was educated regarding some side effects related to NS infusions and benefits of helping with body discomfort. Pt education/information placed in my chart. Pt declined AVS, VSS and ambulatory at discharge.

## 2024-08-09 ENCOUNTER — Ambulatory Visit: Admitting: Rehabilitative and Restorative Service Providers"

## 2024-08-11 ENCOUNTER — Ambulatory Visit: Attending: Cardiology | Admitting: Cardiology

## 2024-08-11 ENCOUNTER — Encounter: Payer: Self-pay | Admitting: Cardiology

## 2024-08-11 VITALS — BP 106/70 | HR 71 | Ht 65.0 in | Wt 140.5 lb

## 2024-08-11 DIAGNOSIS — I471 Supraventricular tachycardia, unspecified: Secondary | ICD-10-CM | POA: Diagnosis not present

## 2024-08-11 DIAGNOSIS — R002 Palpitations: Secondary | ICD-10-CM | POA: Diagnosis not present

## 2024-08-11 NOTE — Progress Notes (Signed)
 Electrophysiology Office Note:   Date:  08/11/2024  ID:  Kathryn Mosley, DOB August 20, 1971, MRN [id number]  Primary Cardiologist: None Primary Heart Failure: None Electrophysiologist: None      History of Present Illness:   Kathryn Mosley is a 53 y.o. female with h/o hypothyroidism, palpitations seen today for  for Electrophysiology evaluation of palpitations at the request of Kathryn Mosley.    She was previously seen by cardiology at [facility] in March 2025.  At the time she was feeling well, but did complain of palpitations to her primary physician.  She has also had shortness of breath.  She had a workup for pulmonary embolism as she had recently had a prolonged travel to [place].  She wore a 30-day monitor with a less than 0.01% A-fib burden and no an episode of apparent SVT that lasted 90 seconds at 171 bpm.  Her main symptom episodes were associated with sinus tachycardia.  She had an echo with a normal ejection fraction in 2024.  Discussed the use of AI scribe software for clinical note transcription with the patient, who gave verbal consent to proceed.  History of Present Illness Kathryn Mosley is a 53 year old female with POTS who presents for a second opinion on a 30-day Holter monitor report. She is accompanied by her husband.  She underwent a 30-day Holter monitor test, during which significant activity was noted on the second day. She was initially informed of atrial fibrillation and started on Eliquis, with her stroke risk evaluated.  Halfway through the monitoring period, she began taking beta blockers. During the first two weeks, her heart rate reached up to 160 beats per minute, notably at 10:15 AM while standing still. She has a history of POTS, which has previously caused significant increases in heart rate, especially with exercise or standing. Her heart rate has varied, with an average rate being normal and a low of 45 beats per minute. She experiences palpitations and a sensation of her  heart 'dancing around,' but these episodes are brief, lasting about a minute and a half.  She is on a small dose of metoprolol, which she describes as a 'godsend,' as it has helped her not notice her heart rate as much, except for occasional palpitations. She is relieved to no longer be on Eliquis, as it did not make her feel well.   Review of systems complete and found to be negative unless listed in HPI.   EP Information / Studies Reviewed:    EKG is ordered today. Personal review as below.  EKG Interpretation Date/Time:  Wednesday August 11 2024 14:30:53 EST Ventricular Rate:  76 PR Interval:  168 QRS Duration:  82 QT Interval:  362 QTC Calculation: 407 R Axis:   70  Text Interpretation: Normal sinus rhythm Normal ECG No previous ECGs available Confirmed by Kathryn Mosley ([id number]) on 08/11/2024 2:41:48 PM     Risk Assessment/Calculations:            Physical Exam:   VS:  BP 106/70 (BP Location: Left Arm, Patient Position: Sitting, Cuff Size: Normal)   Pulse 71   Ht 5' 5 (1.651 m)   Wt 140 lb 8 oz (63.7 kg)   SpO2 97%   BMI 23.38 kg/m    Wt Readings from Last 3 Encounters:  08/11/24 140 lb 8 oz (63.7 kg)  08/04/24 138 lb (62.6 kg)  06/30/24 138 lb (62.6 kg)     GEN: Well nourished, well developed in no acute distress NECK: No JVD;  No carotid bruits CARDIAC: Regular rate and rhythm, no murmurs, rubs, gallops RESPIRATORY:  Clear to auscultation without rales, wheezing or rhonchi  ABDOMEN: Soft, non-tender, non-distended EXTREMITIES:  No edema; No deformity   ASSESSMENT AND PLAN:    1.  SVT: Has had a few episodes of SVT noted on her cardiac monitor.  She has been started on metoprolol which has significantly improved her symptoms.  She comes asking whether or not she could proceed with ablation.  At this time, as she has been comfortable on her metoprolol, I have told her that it is reasonable to continue with his current management.  She Kathryn Mosley go back to her  primary EP for further management.  I told her that I be happy to see her back in the future if necessary.  Follow up with EP Team as needed   Signed, Kathryn Meno Gladis Norton, MD

## 2024-08-16 ENCOUNTER — Encounter: Payer: Self-pay | Admitting: Rehabilitative and Restorative Service Providers"

## 2024-08-16 ENCOUNTER — Ambulatory Visit: Admitting: Rehabilitative and Restorative Service Providers"

## 2024-08-16 DIAGNOSIS — M6281 Muscle weakness (generalized): Secondary | ICD-10-CM | POA: Insufficient documentation

## 2024-08-16 DIAGNOSIS — R29898 Other symptoms and signs involving the musculoskeletal system: Secondary | ICD-10-CM | POA: Insufficient documentation

## 2024-08-16 DIAGNOSIS — M222X2 Patellofemoral disorders, left knee: Secondary | ICD-10-CM | POA: Diagnosis present

## 2024-08-16 DIAGNOSIS — M25562 Pain in left knee: Secondary | ICD-10-CM | POA: Diagnosis present

## 2024-08-16 NOTE — Therapy (Signed)
 OUTPATIENT PHYSICAL THERAPY LOWER EXTREMITY TREATMENT    Patient Name: Arelly Whittenberg MRN: [id number] DOB:11-15-1970, 53 y.o., female Today's Date: 08/16/2024  END OF SESSION:  PT End of Session - 08/16/24 1019     Visit Number 9    Number of Visits 16    Date for Recertification  09/15/24    Authorization Type [employer] state plan no co-pay    PT Start Time 1015    PT Stop Time 1100    PT Time Calculation (min) 45 min    Activity Tolerance Patient tolerated treatment well          Past Medical History:  Diagnosis Date   Allergy [birth year]1972   Gluten   Anemia    Arthritis    GERD (gastroesophageal reflux disease)    Past Surgical History:  Procedure Laterality Date   CESAREAN SECTION  1994   COSMETIC SURGERY  1989   Rhinoplasty/Liposuction   Patient Active Problem List   Diagnosis Date Noted   Ehlers-Danlos disease 05/19/2024   Bilateral carpal tunnel syndrome 10/10/2022   Celiac disease 02/19/2021   DDD (degenerative disc disease), cervical 01/31/2021   Lumbar spondylosis 01/31/2021   Labral tear of hip, degenerative 01/31/2021   Folate deficiency 01/20/2020   Vitamin D deficiency 01/20/2020   GERD (gastroesophageal reflux disease) 01/20/2020   Primary osteoarthritis of left hip 10/24/2017   FH: breast cancer in first degree relative 09/22/2013   Perimenopausal 11/25/2012   H/O abnormal cervical Papanicolaou smear 08/01/2011   Hypothyroidism 04/14/2009    PCP: Dr Torrence CINDERELLA Barrier  REFERRING PROVIDER: Dr Redell DELENA Robes   REFERRING DIAG: L knee patellofemoral pain   THERAPY DIAG:  Acute pain of left knee  Patellofemoral pain syndrome of left knee  Other symptoms and signs involving the musculoskeletal system  Muscle weakness (generalized)  Rationale for Evaluation and Treatment: Rehabilitation  ONSET DATE: 04/04/24  SUBJECTIVE:   SUBJECTIVE STATEMENT: Patient reports that she was doing great until she awoke Saturday morning pain in the R hip bone. The  knob is so sore and hurts. She does not know of anything she did that would irritate that hip. She has difficulty with exercises and walking. She is having some clicking in the R hip. She working on her exercises at home as she can. Trying to avoid crossing legs or ankles.   She was scheduled for spinal ablation but procedure was cancelled until she has further cardiac evaluation.  Eval: Patient reports that she has had L knee pain over the past six weeks. She does not remember any recent incident or accident. She did have injury to L knee while running ~ 4 yrs ago which gradually resolved. Currently having pain and popping in the L knee with walking most of the time. Pain increases with increased time walking. She has been sedentary in the past three years due to medical conditions noted below  PERTINENT HISTORY: Ehlers-Danlos, a-fib; POTS; Lyme disease; chronic LBP; lumbar spondylosis; cervical pain; spinal narrowing; ACL repair Rt x 2; torn labrum bilat hips s/p debridement ~ 7 yrs ago Rt; 6 yrs Lt; c-section 1994  PAIN:  Are you having pain? Yes: NPRS scale: 6/10 at rest; 6/10 with activity Pain location: L knee laterally will have pain on the inside of the knee with increased activity  Pain description: aching and sharp  Aggravating factors: walking > 1 block or 5 min; jumping  Relieving factors: inactivity   PRECAUTIONS: None  WEIGHT BEARING RESTRICTIONS: No  FALLS:  Has patient fallen in last 6 months? No  LIVING ENVIRONMENT: Lives with: lives with their spouse Lives in: House/apartment Stairs: No Has following equipment at home: None  OCCUPATION: self employed free conservation officer, historic buildings; housheold chores; was walking ~ 1 hour daily and doing hiking prior to chronic medical conditions   PATIENT GOALS: stop clicking; be able to walk 1 mile   NEXT MD VISIT: 08/02/24  OBJECTIVE:  Note: Objective measures were completed at Evaluation unless otherwise noted.  DIAGNOSTIC FINDINGS:   xrays L knee results not available   05/19/24: L knee US : Osteophytic change noted in L patellofemoral joint with small amount of synovial hypertrophy.  Possible fissuring in lateral meniscus without significant extrusion.  Hypoechogenic fluid signal in IT band as it traverses the lateral joint.   PATIENT SURVEYS:  PSFS: THE PATIENT SPECIFIC FUNCTIONAL SCALE  Place score of 0-10 (0 = unable to perform activity and 10 = able to perform activity at the same level as before injury or problem)  Activity Date: 05/24/24 Date: 08/02/24   Walking > 5 min  6 10   2. Jumping  2 3   3. Squatting  0 2   4.  0    Total Score 8 15     Total Score = Sum of activity scores/number of activities 8/3= 2.67 08/02/24 - 15/3 = 5  Minimally Detectable Change: 3 points (for single activity); 2 points (for average score)  [facility] (nd). The Patient Specific Functional Scale . Retrieved from [email or web address]     MUSCLE LENGTH: Hamstrings: Right 70 deg; Left 65 deg Tight hip flexors bilat  06/23/24: improved hamstring extensibility; gradual improving hip flexor extensibility but remains tight in iliopsoas R > L  08/02/24: hamstring flexibility WNL's bilat; hip flexors significantly improved slightly tighter R > L   POSTURE: rounded shoulders, forward head, anterior pelvic tilt, flexed trunk , and R hip elevated compared to L; stands with knees hyperextended   PALPATION: Tightness lateral distal quad at insertion to lateral superior pole of patella  Pain R > L iliacus  08/02/24: no significant tightness at L patella  Minimal tightness remaining at R > L iliacus  08/16/24: tightness R lateral hip through greater trochanter; ITB; glut attachment posterior greater trochanter  LOWER EXTREMITY ROM: WFL's   Active ROM Right eval Left eval  Hip flexion    Hip extension    Hip abduction    Hip adduction    Hip internal rotation     Hip external rotation    Knee flexion    Knee extension    Ankle dorsiflexion    Ankle plantarflexion    Ankle inversion    Ankle eversion     (Blank rows = not tested)  LOWER EXTREMITY MMT:  MMT Right eval Right  06/23/24 Right  08/02/24 Left eval Left 06/23/24 Left  08/02/24  Hip flexion 4+ 5 5 4- 5- 5  Hip extension 4 4+ 5- 4 4+ 5-  Hip abduction 4+ 5 5 4- 4+ 5-  Hip adduction        Hip internal rotation        Hip external rotation        Knee flexion 5 5  4 5 5   Knee extension 5 5  4 5 5   Ankle dorsiflexion        Ankle plantarflexion        Ankle inversion        Ankle eversion         (  Blank rows = not tested)  LOWER EXTREMITY SPECIAL TESTS:  Knee special tests: Patellafemoral apprehension test: positive  and Patellafemoral grind test: positive   FUNCTIONAL TESTS:  5 times sit to stand: 11.21 sec SLS R 10 sec unsteady; L 10 sec unsteady with use of UE for balance  08/02/24:  5 times sit to stand; 11.3 sec no pain  SLS R 10 sec improved stability; L 10 sec improved stability no use of UE's   GAIT: Distance walked: 40 feet Assistive device utilized: None Level of assistance: Complete Independence Comments: wide based gait   [facility] Adult PT Treatment:                                                DATE: 08/16/24 Therapeutic Exercise: Hamstring stretch 30 sec x 2 repeated following manual work  IT band stretch 30 sec x 3 repeated following manual work  Hip adductor stretch with strap 30 sec x 2 repeated following manual work  Piriformis stretch supine 30 sec x 2 repeated following manual work  Manual Therapy: TPR/STM hip flexors; extensors; laterally through ITB/ lateral hip  Myofacial release work R LE into [employer] passively  Passive hip IR/ER patient in prone  Quad stretch prone PT assist 30 sec x 2  Neuromuscular re-ed: Myofacial ball release psoas and gluts/piriformis bilat (HEP)  Therapeutic Activity: Standing weight bearing bilat LE's  Weight shift  in sitting pressing into the thighs  Weight bearing bilat LE in standing  Weight shift laterally side to side slowly  Weight shift small step forward with each foot ~ 8-10 reps each  Self Care: Kinesotaping for patellar alignment  Ball release work    [facility] Adult PT Treatment:                                                DATE: 08/02/24 Therapeutic Exercise: Hamstring stretch 30 sec x 2 IT band stretch 30 sec x 3  Manual Therapy: Medial glide for patella Kinesotaping for patella realignment  Neuromuscular re-ed: Myofacial ball release psoas and gluts/piriformis bilat (HEP) Pelvis realignment supine and standing 2 sec x 10 (HEP) Quad set 3 sec x 10 x 2  SLR in slight ER 3 sec x 10  SLS R/L ~ 10 sec x 10 R/L  Therapeutic Activity: Bridge blue TB 5 sec hold x 10; repeated with ball btn knees 5 sec x 10  Hip abduction in hooklying alternating LE's  blue TB 3 sec x 10 R/L  Clamshell green TB thigh 3 sec x 10 R/L  Reverse clam shell green TB 3 sec x 10 R/L Wall squat with ball btn knees 5 sec x 10  Sidelying hip adduction L LE  ITB stretch sidelying 30 sec x 1 R/L  Step up 2 inch step x 10 x 2 R/L - fingertips to assist with balance on L  Sit to stand from elevated table moving slowly x 10  Side steps green TB distal thigh 8 feet x 5 R/L  Tandem walking and backward walking 8 feet x 5 each  Self Care: Kinesotaping for patellar alignment  Ball release work    [facility] Adult PT Treatment:  DATE: 07/26/24 Therapeutic Exercise: Hamstring stretch 30 sec x 2 IT band stretch 30 sec x 3  Manual Therapy: Medial glide for patella Kinesotaping for patella realignment  Neuromuscular re-ed: Myofacial ball release psoas and gluts/piriformis bilat (HEP) Pelvis realignment supine and standing 2 sec x 10 (HEP) Quad set 3 sec x 10 x 2  SLR in slight ER 3 sec x 10  SLS R/L ~ 10 sec x 10 R/L  Therapeutic Activity: Bridge green TB 5 sec hold x 10;  repeated with ball btn knees 5 sec x 10  Hip abduction in hooklying alternating LE's green (increase to blue) TB 3 sec x 10 R/L  Clamshell green TB thigh 3 sec x 10 R/L  Reverse clam shell green TB 3 sec x 10 R/L Wall squat with ball btn knees 5 sec x 10  Sidelying hip adduction L LE  ITB stretch sidelying 30 sec x 1 R/L  Step up 2 inch step x 10 x 2 R/L - fingertips to assist with balance on L  Sit to stand from elevated table moving slowly x 10  Side steps 8 feet x 5 R/L  Tandem walking and backward walking 8 feet x 5 each  Self Care: Kinesotaping for patellar alignment  Ball release work    PATIENT EDUCATION:  Education details: POC; HEP  Person educated: Patient Education method: Programmer, Multimedia, Facilities Manager, Actor cues, Verbal cues, and Handouts Education comprehension: verbalized understanding, returned demonstration, verbal cues required, tactile cues required, and needs further education  HOME EXERCISE PROGRAM: Access Code: [email or web address] URL: [email or web address] Date: 08/02/2024 Prepared by: Maryiah Olvey  Exercises - Supine Quad Set  - 1 x daily - 7 x weekly - 1 sets - 10 reps - 3 sec  hold - Straight Leg Raise with External Rotation  - 1 x daily - 7 x weekly - 1 sets - 10 reps - 3-5 sec  hold - Hooklying Hamstring Stretch with Strap  - 1 x daily - 7 x weekly - 1 sets - 3 reps - 30 sec  hold - Supine ITB Stretch with Strap  - 1 x daily - 7 x weekly - 1 sets - 3 reps - 30 sec  hold - Hip Flexor Stretch at Edge of Bed  - 1 x daily - 7 x weekly - 1 sets - 3 reps - 30 sec  hold - Supine Transversus Abdominis Bracing with Pelvic Floor Contraction  - 2 x daily - 7 x weekly - 1 sets - 10 reps - 10sec  hold - Wall Quarter Squat  - 2 x daily - 7 x weekly - 1-2 sets - 10 reps - 5-10 sec  hold - Bridge  - 2 x daily - 7 x weekly - 1-2 sets - 10 reps - 5 sec  hold - Supine Bridge with Mini Swiss Ball Between Knees  - 1 x daily - 7 x weekly - 1-2 sets - 10 reps - 5 sec   hold - Wall Squat Hold with Ball  - 1 x daily - 7 x weekly - 1-2 sets - 10 reps - 5-10 sec  hold - Sit to Stand  - 1 x daily - 7 x weekly - 1 sets - 10 reps - 3-5 sec  hold - Hooklying Isometric Clamshell  - 2 x daily - 7 x weekly - 1 sets - 10 reps - 3 sec  hold - Supine Bridge with Resistance Band  - 2 x daily - 7 x weekly -  1-2 sets - 5-10 reps - 5-10 sec  hold - Sidelying Hip Abduction  - 1 x daily - 7 x weekly - 3 sets - 10 reps - 3-5 sec  hold - Single Leg Stance with Support  - 1 x daily - 7 x weekly - 1 sets - 3 reps - 30 sec  hold - Clam with Resistance  - 1 x daily - 7 x weekly - 1 sets - 10 reps - 3-5 sec  hold - Step Up  - 2 x daily - 7 x weekly - 1 sets - 10 reps - 2 sec  hold - Sit to Stand  - 2 x daily - 7 x weekly - 1 sets - 10 reps - 3-5 sec  hold - Side Stepping with Resistance at Thighs  - 1 x daily - 7 x weekly - Tandem Walking  - 1 x daily - 7 x weekly - 1 sets - 3 reps - 30 sec  hold - Backwards Walking  - 1 x daily - 7 x weekly - 1 sets - 3 reps - 30 sec  hold - Plank on Counter  - 1 x daily - 7 x weekly - 1 sets - 3 reps - 30 sec  hold  ASSESSMENT:  CLINICAL IMPRESSION: Patient reports onset of severe R hip pain Saturday morning when she work with no known injury of irritation. She has pain in the lateral R hip to palpation and muscular tightness and tenderness to palpation anteriorly, posterior, lateral hip. Focus of treatment today including manual work, myofacial release work; passive stretch LE's; weight shift in standing. Patient demonstrated significant improvement in standing and walking following treatment. She will focus on stretching and ball release work to elongate tissue and calm symptoms then gradually resume strengthening exercises. She was gaining strength in hips and core and progressing functional activities until flare up of symptoms which seem muscular in nature and responded well to treatment today.    Eval: Patient is a 53 y.o. female who was seen  today for physical therapy evaluation and treatment for L knee patellofemoral pain.   OBJECTIVE IMPAIRMENTS: Abnormal gait, decreased activity tolerance, decreased balance, decreased strength, improper body mechanics, and pain.    GOALS: Goals reviewed with patient? Yes  SHORT TERM GOALS: Target date: 06/21/2024   Independent in initial HEP  Baseline: Goal status: met  2.  Patient reports ability to walk without L knee popping Baseline:  Goal status: on going   3.  Patient reports increased walking tolerance to 10 min without increased L knee pain  Baseline:  Goal status: on going    LONG TERM GOALS: Target date: 09/15/2024   Decrease L knee pain by 50-75% allowing patient to increase functional activities and exercises tolerance  Baseline:  Goal status: on going   2.  Increase strength bilat LE's to 4+/5 to 5/5  Baseline:  Goal status: on going   3.  Patient reports ability to tolerate exercise video involving jumping with minimal to no increase in L knee pain  Baseline:  Goal status: on going   4.  Patient reports and demonstrates ability to squat and return to stand with minimal to no increase in L knee pain  Baseline:  Goal status: on going   5.  PSFS: THE PATIENT SPECIFIC FUNCTIONAL SCALE  Place score of 0-10 (0 = unable to perform activity and 10 = able to perform activity at the same level as before injury or problem)  Activity Date: 05/24/24    Walking > 5 min  6 10   2. Jumping  2 3   3. Squatting  0 2   4.  0    Total Score 8 10     Total Score = Sum of activity scores/number of activities 8/3= 2.67 08/02/24: 15/3 = 5   Minimally Detectable Change: 3 points (for single activity); 2 points (for average score) Baseline:  Goal status: on going   6.  Independent in advanced HEP including aquatic program as indicated  Baseline:  Goal status: on going    PLAN:  PT FREQUENCY: 2x/week  PT DURATION: 8 weeks  PLANNED INTERVENTIONS: 97164- PT  Re-evaluation, 97110-Therapeutic exercises, 97530- Therapeutic activity, 97112- Neuromuscular re-education, 97535- Self Care, [id number]- Manual therapy, [phone]- Gait training, [phone]- Aquatic Therapy, Patient/Family education, Balance training, Stair training, Taping, and Joint mobilization  PLAN FOR NEXT SESSION: review and progress exercises; continue with joint conservation and ergonomic education; manual work and modalities as indicated - - continue with functional strengthening as tolerated including mini step up and SLS   Taffany Heiser P Dallis Czaja, PT 08/16/2024, 10:20 AM

## 2024-08-23 ENCOUNTER — Encounter: Payer: Self-pay | Admitting: Rehabilitative and Restorative Service Providers"

## 2024-08-23 ENCOUNTER — Ambulatory Visit: Admitting: Rehabilitative and Restorative Service Providers"

## 2024-08-23 DIAGNOSIS — M25562 Pain in left knee: Secondary | ICD-10-CM | POA: Diagnosis not present

## 2024-08-23 DIAGNOSIS — M222X2 Patellofemoral disorders, left knee: Secondary | ICD-10-CM

## 2024-08-23 DIAGNOSIS — R29898 Other symptoms and signs involving the musculoskeletal system: Secondary | ICD-10-CM

## 2024-08-23 DIAGNOSIS — M6281 Muscle weakness (generalized): Secondary | ICD-10-CM

## 2024-08-23 NOTE — Therapy (Signed)
 OUTPATIENT PHYSICAL THERAPY LOWER EXTREMITY TREATMENT    Patient Name: Kathryn Mosley MRN: [id number] DOB:Apr 27, 1971, 53 y.o., female Today's Date: 08/23/2024  END OF SESSION:  PT End of Session - 08/23/24 1149     Visit Number 10    Number of Visits 16    Date for Recertification  09/15/24    Authorization Type [employer] state plan no co-pay    PT Start Time 1145    PT Stop Time 1230    PT Time Calculation (min) 45 min    Activity Tolerance Patient tolerated treatment well          Past Medical History:  Diagnosis Date   Allergy 11/1972   Gluten   Anemia    Arthritis    GERD (gastroesophageal reflux disease)    Past Surgical History:  Procedure Laterality Date   CESAREAN SECTION  1994   COSMETIC SURGERY  1989   Rhinoplasty/Liposuction   Patient Active Problem List   Diagnosis Date Noted   Ehlers-Danlos disease 05/19/2024   Bilateral carpal tunnel syndrome 10/10/2022   Celiac disease 02/19/2021   DDD (degenerative disc disease), cervical 01/31/2021   Lumbar spondylosis 01/31/2021   Labral tear of hip, degenerative 01/31/2021   Folate deficiency 01/20/2020   Vitamin D deficiency 01/20/2020   GERD (gastroesophageal reflux disease) 01/20/2020   Primary osteoarthritis of left hip 10/24/2017   FH: breast cancer in first degree relative 09/22/2013   Perimenopausal 11/25/2012   H/O abnormal cervical Papanicolaou smear 08/01/2011   Hypothyroidism 04/14/2009    PCP: Dr Torrence CINDERELLA Barrier  REFERRING PROVIDER: Dr Redell DELENA Robes   REFERRING DIAG: L knee patellofemoral pain   THERAPY DIAG:  Acute pain of left knee  Patellofemoral pain syndrome of left knee  Other symptoms and signs involving the musculoskeletal system  Muscle weakness (generalized)  Rationale for Evaluation and Treatment: Rehabilitation  ONSET DATE: 04/04/24  SUBJECTIVE:   SUBJECTIVE STATEMENT: Patient reports that she is doing much better than last week. She has less pain and has started  increasing her activity level again. She is having minimal clicking in the R hip. She working on her exercises at home again. Trying to avoid crossing legs or ankles. Teylor will be out of town 09/04/24-09/13/24. Will continue with exercises as tolerated, gradually returning to level she was doing prior to flare up.   She was scheduled for spinal ablation but procedure was cancelled until she has further cardiac evaluation.  Eval: Patient reports that she has had L knee pain over the past six weeks. She does not remember any recent incident or accident. She did have injury to L knee while running ~ 4 yrs ago which gradually resolved. Currently having pain and popping in the L knee with walking most of the time. Pain increases with increased time walking. She has been sedentary in the past three years due to medical conditions noted below  PERTINENT HISTORY: Ehlers-Danlos, a-fib; POTS; Lyme disease; chronic LBP; lumbar spondylosis; cervical pain; spinal narrowing; ACL repair Rt x 2; torn labrum bilat hips s/p debridement ~ 7 yrs ago Rt; 6 yrs Lt; c-section 1994  PAIN:  Are you having pain? Yes: NPRS scale: 1-2/10 at rest; 2/10 with activity Pain location: L knee laterally will have pain on the inside of the knee with increased activity  Pain description: aching   Aggravating factors: walking > 2 blocks or 15 min; jumping  Relieving factors: inactivity   PRECAUTIONS: None  WEIGHT BEARING RESTRICTIONS: No  FALLS:  Has  patient fallen in last 6 months? No  LIVING ENVIRONMENT: Lives with: lives with their spouse Lives in: House/apartment Stairs: No Has following equipment at home: None  OCCUPATION: self employed free conservation officer, historic buildings; housheold chores; was walking ~ 1 hour daily and doing hiking prior to chronic medical conditions   PATIENT GOALS: stop clicking; be able to walk 1 mile   NEXT MD VISIT: 08/02/24  OBJECTIVE:  Note: Objective measures were completed at Evaluation unless otherwise  noted.  DIAGNOSTIC FINDINGS:  xrays L knee results not available   05/19/24: L knee US : Osteophytic change noted in L patellofemoral joint with small amount of synovial hypertrophy.  Possible fissuring in lateral meniscus without significant extrusion.  Hypoechogenic fluid signal in IT band as it traverses the lateral joint.   PATIENT SURVEYS:  PSFS: THE PATIENT SPECIFIC FUNCTIONAL SCALE  Place score of 0-10 (0 = unable to perform activity and 10 = able to perform activity at the same level as before injury or problem)  Activity Date: 05/24/24 Date: 08/02/24   Walking > 5 min  6 10   2. Jumping  2 3   3. Squatting  0 2   4.  0    Total Score 8 15     Total Score = Sum of activity scores/number of activities 8/3= 2.67 08/02/24 - 15/3 = 5  Minimally Detectable Change: 3 points (for single activity); 2 points (for average score)  [facility] (nd). The Patient Specific Functional Scale . Retrieved from [email or web address]     MUSCLE LENGTH: Hamstrings: Right 70 deg; Left 65 deg Tight hip flexors bilat  06/23/24: improved hamstring extensibility; gradual improving hip flexor extensibility but remains tight in iliopsoas R > L  08/02/24: hamstring flexibility WNL's bilat; hip flexors significantly improved slightly tighter R > L   POSTURE: rounded shoulders, forward head, anterior pelvic tilt, flexed trunk , and R hip elevated compared to L; stands with knees hyperextended   PALPATION: Tightness lateral distal quad at insertion to lateral superior pole of patella  Pain R > L iliacus  08/02/24: no significant tightness at L patella  Minimal tightness remaining at R > L iliacus  08/16/24: tightness R lateral hip through greater trochanter; ITB; glut attachment posterior greater trochanter  LOWER EXTREMITY ROM: WFL's   Active ROM Right eval Left eval  Hip flexion    Hip extension    Hip abduction    Hip adduction     Hip internal rotation    Hip external rotation    Knee flexion    Knee extension    Ankle dorsiflexion    Ankle plantarflexion    Ankle inversion    Ankle eversion     (Blank rows = not tested)  LOWER EXTREMITY MMT:  MMT Right eval Right  06/23/24 Right  08/02/24 Left eval Left 06/23/24 Left  08/02/24  Hip flexion 4+ 5 5 4- 5- 5  Hip extension 4 4+ 5- 4 4+ 5-  Hip abduction 4+ 5 5 4- 4+ 5-  Hip adduction        Hip internal rotation        Hip external rotation        Knee flexion 5 5  4 5 5   Knee extension 5 5  4 5 5   Ankle dorsiflexion        Ankle plantarflexion        Ankle inversion        Ankle eversion         (  Blank rows = not tested)  LOWER EXTREMITY SPECIAL TESTS:  Knee special tests: Patellafemoral apprehension test: positive  and Patellafemoral grind test: positive   FUNCTIONAL TESTS:  5 times sit to stand: 11.21 sec SLS R 10 sec unsteady; L 10 sec unsteady with use of UE for balance  08/02/24:  5 times sit to stand; 11.3 sec no pain  SLS R 10 sec improved stability; L 10 sec improved stability no use of UE's   GAIT: Distance walked: 40 feet Assistive device utilized: None Level of assistance: Complete Independence Comments: wide based gait   [facility] Adult PT Treatment:                                                DATE: 08/23/24 Therapeutic Exercise: Hamstring stretch 30 sec x 2 R/L Hamstring stretch with ankle DF to stretch calf 30 sec x 2  R/L IT band stretch 30 sec x 3 repeated following manual work  Hip adductor stretch with strap 30 sec x 2 repeated following manual work  Piriformis stretch supine 30 sec x 2 repeated following manual work  Hip flexor stretch supine 30 sec x 2 R/L using strap to lift LE from stretch position  Manual Therapy: TPR/STM hip flexors; extensors; laterally through ITB/ lateral hip  Myofacial release work R/L LE into IR passively  Passive hip IR/ER patient in prone  Quad stretch prone PT assist 30 sec x 2   Neuromuscular re-ed: Myofacial ball release psoas and gluts/piriformis bilat (HEP) Therapeutic Activity: Standing weight bearing bilat LE's  Sitting anterior/posterior pelvic tilt partial range (some LBP)  Happy baby supine  Self Care: Ball release work    [facility] Adult PT Treatment:                                                DATE: 08/16/24 Therapeutic Exercise: Hamstring stretch 30 sec x 2 repeated following manual work  IT band stretch 30 sec x 3 repeated following manual work  Hip adductor stretch with strap 30 sec x 2 repeated following manual work  Piriformis stretch supine 30 sec x 2 repeated following manual work  Manual Therapy: TPR/STM hip flexors; extensors; laterally through ITB/ lateral hip  Myofacial release work R LE into [employer] passively  Passive hip IR/ER patient in prone  Quad stretch prone PT assist 30 sec x 2  Neuromuscular re-ed: Myofacial ball release psoas and gluts/piriformis bilat (HEP)  Therapeutic Activity: Standing weight bearing bilat LE's  Weight shift in sitting pressing into the thighs  Weight bearing bilat LE in standing  Weight shift laterally side to side slowly  Weight shift small step forward with each foot ~ 8-10 reps each  Self Care: Kinesotaping for patellar alignment  Ball release work    [facility] Adult PT Treatment:                                                DATE: 08/02/24 Therapeutic Exercise: Hamstring stretch 30 sec x 2 IT band stretch 30 sec x 3  Manual Therapy: Medial glide for patella Kinesotaping for patella  realignment  Neuromuscular re-ed: Myofacial ball release psoas and gluts/piriformis bilat (HEP) Pelvis realignment supine and standing 2 sec x 10 (HEP) Quad set 3 sec x 10 x 2  SLR in slight ER 3 sec x 10  SLS R/L ~ 10 sec x 10 R/L  Therapeutic Activity: Bridge blue TB 5 sec hold x 10; repeated with ball btn knees 5 sec x 10  Hip abduction in hooklying alternating LE's  blue TB 3 sec x 10 R/L  Clamshell green TB  thigh 3 sec x 10 R/L  Reverse clam shell green TB 3 sec x 10 R/L Wall squat with ball btn knees 5 sec x 10  Sidelying hip adduction L LE  ITB stretch sidelying 30 sec x 1 R/L  Step up 2 inch step x 10 x 2 R/L - fingertips to assist with balance on L  Sit to stand from elevated table moving slowly x 10  Side steps green TB distal thigh 8 feet x 5 R/L  Tandem walking and backward walking 8 feet x 5 each  Self Care: Kinesotaping for patellar alignment  Ball release work   PATIENT EDUCATION:  Education details: POC; HEP  Person educated: Patient Education method: Programmer, Multimedia, Facilities Manager, Actor cues, Verbal cues, and Handouts Education comprehension: verbalized understanding, returned demonstration, verbal cues required, tactile cues required, and needs further education  HOME EXERCISE PROGRAM: Access Code: [email or web address] URL: [email or web address] Date: 08/23/2024 Prepared by: Taelyn Broecker  Exercises - Supine Quad Set  - 1 x daily - 7 x weekly - 1 sets - 10 reps - 3 sec  hold - Straight Leg Raise with External Rotation  - 1 x daily - 7 x weekly - 1 sets - 10 reps - 3-5 sec  hold - Hooklying Hamstring Stretch with Strap  - 1 x daily - 7 x weekly - 1 sets - 3 reps - 30 sec  hold - Supine ITB Stretch with Strap  - 1 x daily - 7 x weekly - 1 sets - 3 reps - 30 sec  hold - Hip Flexor Stretch at Edge of Bed  - 1 x daily - 7 x weekly - 1 sets - 3 reps - 30 sec  hold - Supine Transversus Abdominis Bracing with Pelvic Floor Contraction  - 2 x daily - 7 x weekly - 1 sets - 10 reps - 10sec  hold - Wall Quarter Squat  - 2 x daily - 7 x weekly - 1-2 sets - 10 reps - 5-10 sec  hold - Bridge  - 2 x daily - 7 x weekly - 1-2 sets - 10 reps - 5 sec  hold - Supine Bridge with Mini Swiss Ball Between Knees  - 1 x daily - 7 x weekly - 1-2 sets - 10 reps - 5 sec  hold - Wall Squat Hold with Ball  - 1 x daily - 7 x weekly - 1-2 sets - 10 reps - 5-10 sec  hold - Sit to Stand  - 1 x daily - 7 x  weekly - 1 sets - 10 reps - 3-5 sec  hold - Hooklying Isometric Clamshell  - 2 x daily - 7 x weekly - 1 sets - 10 reps - 3 sec  hold - Supine Bridge with Resistance Band  - 2 x daily - 7 x weekly - 1-2 sets - 5-10 reps - 5-10 sec  hold - Sidelying Hip Abduction  - 1 x daily - 7 x weekly - 3  sets - 10 reps - 3-5 sec  hold - Single Leg Stance with Support  - 1 x daily - 7 x weekly - 1 sets - 3 reps - 30 sec  hold - Clam with Resistance  - 1 x daily - 7 x weekly - 1 sets - 10 reps - 3-5 sec  hold - Step Up  - 2 x daily - 7 x weekly - 1 sets - 10 reps - 2 sec  hold - Sit to Stand  - 2 x daily - 7 x weekly - 1 sets - 10 reps - 3-5 sec  hold - Side Stepping with Resistance at Thighs  - 1 x daily - 7 x weekly - Tandem Walking  - 1 x daily - 7 x weekly - 1 sets - 3 reps - 30 sec  hold - Backwards Walking  - 1 x daily - 7 x weekly - 1 sets - 3 reps - 30 sec  hold - Plank on Counter  - 1 x daily - 7 x weekly - 1 sets - 3 reps - 30 sec  hold - Supine Pelvic Floor Stretch  - 2 x daily - 7 x weekly  ASSESSMENT:  CLINICAL IMPRESSION: Patient reports good resolution of the severe R hip pain she experienced last week. Now has minimal pain. She has added some of exercises back to HEP and is trying to walk ~ 4000 steps a day. Continued manual work through the hip flexors, adductors as well as myofacial release LE's into IR. Worked on stretching for LE with VC to engage core with stretches. Added happy baby for HEP. Discussed gradual return to prior level of exercise. She will continue with stretching and ball release work; gradually adding strengthening exercises. Goals continue to include gaining strength in hips and core and progressing functional activities. Discussed activity level when travelling.    Eval: Patient is a 53 y.o. female who was seen today for physical therapy evaluation and treatment for L knee patellofemoral pain.   OBJECTIVE IMPAIRMENTS: Abnormal gait, decreased activity tolerance, decreased  balance, decreased strength, improper body mechanics, and pain.    GOALS: Goals reviewed with patient? Yes  SHORT TERM GOALS: Target date: 06/21/2024   Independent in initial HEP  Baseline: Goal status: met  2.  Patient reports ability to walk without L knee popping Baseline:  Goal status: on going   3.  Patient reports increased walking tolerance to 10 min without increased L knee pain  Baseline:  Goal status: on going    LONG TERM GOALS: Target date: 09/15/2024   Decrease L knee pain by 50-75% allowing patient to increase functional activities and exercises tolerance  Baseline:  Goal status: on going   2.  Increase strength bilat LE's to 4+/5 to 5/5  Baseline:  Goal status: on going   3.  Patient reports ability to tolerate exercise video involving jumping with minimal to no increase in L knee pain  Baseline:  Goal status: on going   4.  Patient reports and demonstrates ability to squat and return to stand with minimal to no increase in L knee pain  Baseline:  Goal status: on going   5.  PSFS: THE PATIENT SPECIFIC FUNCTIONAL SCALE  Place score of 0-10 (0 = unable to perform activity and 10 = able to perform activity at the same level as before injury or problem)  Activity Date: 05/24/24    Walking > 5 min  6 10   2.  Jumping  2 3   3. Squatting  0 2   4.  0    Total Score 8 10     Total Score = Sum of activity scores/number of activities 8/3= 2.67 08/02/24: 15/3 = 5   Minimally Detectable Change: 3 points (for single activity); 2 points (for average score) Baseline:  Goal status: on going   6.  Independent in advanced HEP including aquatic program as indicated  Baseline:  Goal status: on going    PLAN:  PT FREQUENCY: 2x/week  PT DURATION: 8 weeks  PLANNED INTERVENTIONS: 97164- PT Re-evaluation, 97110-Therapeutic exercises, 97530- Therapeutic activity, 97112- Neuromuscular re-education, 97535- Self Care, [id number]- Manual therapy, [phone]- Gait  training, [phone]- Aquatic Therapy, Patient/Family education, Balance training, Stair training, Taping, and Joint mobilization  PLAN FOR NEXT SESSION: review and progress exercises; continue with joint conservation and ergonomic education; manual work and modalities as indicated - - continue with functional strengthening as tolerated including mini step up and SLS   Rochanda Harpham SHAUNNA Baptist, PT 08/23/2024, 12:37 PM

## 2024-09-20 ENCOUNTER — Ambulatory Visit: Admitting: Rehabilitative and Restorative Service Providers"

## 2024-09-22 ENCOUNTER — Ambulatory Visit: Admitting: Rehabilitative and Restorative Service Providers"

## 2024-10-04 ENCOUNTER — Encounter: Payer: Self-pay | Admitting: Rehabilitative and Restorative Service Providers"

## 2024-10-04 ENCOUNTER — Ambulatory Visit: Admitting: Rehabilitative and Restorative Service Providers"

## 2024-10-04 DIAGNOSIS — M6281 Muscle weakness (generalized): Secondary | ICD-10-CM | POA: Insufficient documentation

## 2024-10-04 DIAGNOSIS — M222X2 Patellofemoral disorders, left knee: Secondary | ICD-10-CM | POA: Diagnosis present

## 2024-10-04 DIAGNOSIS — M25562 Pain in left knee: Secondary | ICD-10-CM | POA: Diagnosis present

## 2024-10-04 DIAGNOSIS — R29898 Other symptoms and signs involving the musculoskeletal system: Secondary | ICD-10-CM | POA: Diagnosis present

## 2024-10-04 NOTE — Therapy (Signed)
 " OUTPATIENT PHYSICAL THERAPY LOWER EXTREMITY TREATMENT  PHYSICAL THERAPY DISCHARGE SUMMARY Recert   Visits from Start of Care: 11  Current functional level related to goals / functional outcomes: See progress note - goals accomplished    Remaining deficits: Needs to continue with consistent exercise and make modifications in position for laptop work in supine    Education / Equipment: HEP    Patient agrees to discharge. Patient goals were met. Patient is being discharged due to meeting the stated rehab goals.  Zayra Devito P. Ina PT, MPH 10/04/24 12:14 PM   Patient Name: Kathryn Mosley MRN: [id number] DOB:01-02-1971, 54 y.o., female Today's Date: 10/04/2024  END OF SESSION:  PT End of Session - 10/04/24 1022     Visit Number 11    Number of Visits 16    Date for Recertification  10/04/24    Authorization Type [employer] state plan $70 co pay    PT Start Time 1020    PT Stop Time 1100    PT Time Calculation (min) 40 min    Activity Tolerance Patient tolerated treatment well          Past Medical History:  Diagnosis Date   Allergy [birth year]1972   Gluten   Anemia    Arthritis    GERD (gastroesophageal reflux disease)    Past Surgical History:  Procedure Laterality Date   CESAREAN SECTION  1994   COSMETIC SURGERY  1989   Rhinoplasty/Liposuction   Patient Active Problem List   Diagnosis Date Noted   Ehlers-Danlos disease 05/19/2024   Bilateral carpal tunnel syndrome 10/10/2022   Celiac disease 02/19/2021   DDD (degenerative disc disease), cervical 01/31/2021   Lumbar spondylosis 01/31/2021   Labral tear of hip, degenerative 01/31/2021   Folate deficiency 01/20/2020   Vitamin D deficiency 01/20/2020   GERD (gastroesophageal reflux disease) 01/20/2020   Primary osteoarthritis of left hip 10/24/2017   FH: breast cancer in first degree relative 09/22/2013   Perimenopausal 11/25/2012   H/O abnormal cervical Papanicolaou smear 08/01/2011   Hypothyroidism 04/14/2009    PCP: Dr  Torrence CINDERELLA Barrier  REFERRING PROVIDER: Dr Redell DELENA Robes   REFERRING DIAG: L knee patellofemoral pain   THERAPY DIAG:  Acute pain of left knee  Patellofemoral pain syndrome of left knee  Other symptoms and signs involving the musculoskeletal system  Muscle weakness (generalized)  Rationale for Evaluation and Treatment: Rehabilitation  ONSET DATE: 04/04/24  SUBJECTIVE:   SUBJECTIVE STATEMENT: Patient reports that she is doing much better and no longer having pain in the L knee or hip. She continues to have some discomfort in the L leg compared to R LE but is much better. She has minimal to no pain in the L LE. She is still increasing her activity level again. She is having no clicking in the R hip. She working on her exercises at home again. Trying to avoid crossing legs or ankles. She is having some persistent LBP which has been present for years. Will continue with exercises as tolerated, gradually returning to level she was prior to flare up. She continues to be more sedentary than prior to Lyme's disease.   She was scheduled for spinal ablation but procedure was cancelled until she has further cardiac evaluation.  Eval: Patient reports that she has had L knee pain over the past six weeks. She does not remember any recent incident or accident. She did have injury to L knee while running ~ 4 yrs ago which gradually resolved. Currently having  pain and popping in the L knee with walking most of the time. Pain increases with increased time walking. She has been sedentary in the past three years due to medical conditions noted below  PERTINENT HISTORY: Ehlers-Danlos, a-fib; POTS; Lyme disease; chronic LBP; lumbar spondylosis; cervical pain; spinal narrowing; ACL repair Rt x 2; torn labrum bilat hips s/p debridement ~ 7 yrs ago Rt; 6 yrs Lt; c-section 1994  PAIN:  Are you having pain? Yes: NPRS scale: 1/10 at rest; 2/10 with activity Pain location: L knee laterally will have pain on the  inside of the knee with increased activity  Pain description: aching   Aggravating factors: walking > 2 blocks or 15 min; jumping  Relieving factors: inactivity   PRECAUTIONS: None  WEIGHT BEARING RESTRICTIONS: No  FALLS:  Has patient fallen in last 6 months? No  LIVING ENVIRONMENT: Lives with: lives with their spouse Lives in: House/apartment Stairs: No Has following equipment at home: None  OCCUPATION: self employed free conservation officer, historic buildings; housheold chores; was walking ~ 1 hour daily and doing hiking prior to chronic medical conditions   PATIENT GOALS: stop clicking; be able to walk 1 mile   NEXT MD VISIT: 08/02/24  OBJECTIVE:  Note: Objective measures were completed at Evaluation unless otherwise noted.  DIAGNOSTIC FINDINGS:  xrays L knee results not available   05/19/24: L knee US : Osteophytic change noted in L patellofemoral joint with small amount of synovial hypertrophy.  Possible fissuring in lateral meniscus without significant extrusion.  Hypoechogenic fluid signal in IT band as it traverses the lateral joint.   PATIENT SURVEYS:  PSFS: THE PATIENT SPECIFIC FUNCTIONAL SCALE  Place score of 0-10 (0 = unable to perform activity and 10 = able to perform activity at the same level as before injury or problem)  Activity Date: 05/24/24 Date: 08/02/24 Date: 10/04/24  Walking > 5 min  6 10 10   2. Jumping  2 3 6   3. Squatting  0 2 4  4.  0    Total Score 8 15 20     Total Score = Sum of activity scores/number of activities 8/3= 2.67 08/02/24 - 15/3 = 5 10/04/24 - 20/3 = 6.67  Minimally Detectable Change: 3 points (for single activity); 2 points (for average score)  [facility] (nd). The Patient Specific Functional Scale . Retrieved from [email or web address]     MUSCLE LENGTH: Hamstrings: Right 70 deg; Left 65 deg Tight hip flexors bilat  06/23/24: improved hamstring extensibility; gradual improving hip  flexor extensibility but remains tight in iliopsoas R > L  08/02/24: hamstring flexibility WNL's bilat; hip flexors significantly improved slightly tighter R > L  10/04/24: flexibility WFL's bilat hips   POSTURE: rounded shoulders, forward head, anterior pelvic tilt, flexed trunk , and R hip elevated compared to L; stands with knees hyperextended   PALPATION: Tightness lateral distal quad at insertion to lateral superior pole of patella  Pain R > L iliacus  08/02/24: no significant tightness at L patella  Minimal tightness remaining at R > L iliacus  08/16/24: tightness R lateral hip through greater trochanter; ITB; glut attachment posterior greater trochanter 10/04/24: minimal tightness R lateral hip through greater trochanter; ITB; glut attachment posterior greater trochanter  LOWER EXTREMITY ROM: WFL's   Active ROM Right eval Left eval  Hip flexion    Hip extension    Hip abduction    Hip adduction    Hip internal rotation    Hip external rotation    Knee  flexion    Knee extension    Ankle dorsiflexion    Ankle plantarflexion    Ankle inversion    Ankle eversion     (Blank rows = not tested)  LOWER EXTREMITY MMT:  MMT Right eval Right  06/23/24 Right  08/02/24 Right  10/04/24 Left eval Left 06/23/24 Left  08/02/24 Left 10/04/24  Hip flexion 4+ 5 5 5  4- 5- 5 5  Hip extension 4 4+ 5- 5 4 4+ 5- 5  Hip abduction 4+ 5 5 5  4- 4+ 5- 5  Hip adduction          Hip internal rotation          Hip external rotation          Knee flexion 5 5  5 4 5 5 5   Knee extension 5 5  5 4 5 5 5   Ankle dorsiflexion          Ankle plantarflexion          Ankle inversion          Ankle eversion           (Blank rows = not tested)  LOWER EXTREMITY SPECIAL TESTS:  Knee special tests: Patellafemoral apprehension test: positive  and Patellafemoral grind test: positive   FUNCTIONAL TESTS:  5 times sit to stand: 11.21 sec SLS R 10 sec unsteady; L 10 sec unsteady with use of UE for balance   08/02/24:  5 times sit to stand; 11.3 sec no pain  SLS R 10 sec improved stability; L 10 sec improved stability no use of UE's   GAIT: Distance walked: 40 feet Assistive device utilized: None Level of assistance: Complete Independence Comments: wide based gait   [facility] Adult PT Treatment:                                                DATE: 10/05/23 Therapeutic Exercise: Hamstring stretch 30 sec x 1 R/L Hamstring stretch with ankle DF to stretch calf 30 sec x 1  R/L IT band stretch 30 sec x 3 repeated following manual work  Hip adductor stretch with strap 30 sec x 2 repeated following manual work  Piriformis stretch supine 30 sec x 2 repeated following manual work  Hip flexor stretch supine 30 sec x 2 R/L using strap to lift LE from stretch position  Manual Therapy: TPR/STM hip flexors; extensors; laterally through ITB/ lateral hip  Passive hip IR/ER patient in prone  Neuromuscular re-ed: Myofacial ball release psoas and gluts/piriformis bilat (HEP) Therapeutic Activity: Standing weight bearing bilat LE's  Sit to stand black TB at thighs  Bridging black TB at thighs  Hip abduction alternating LE's black TB at thighs  Happy baby supine  Self Care: To continue ball release work  Modify position for working on laptop lying in bed Consistency with exercise program  Rolling to come to sit from supine    [facility] Adult PT Treatment:                                                DATE: 08/23/24 Therapeutic Exercise: Hamstring stretch 30 sec x 2 R/L Hamstring stretch with ankle DF to stretch calf 30 sec x  2  R/L IT band stretch 30 sec x 3 repeated following manual work  Hip adductor stretch with strap 30 sec x 2 repeated following manual work  Piriformis stretch supine 30 sec x 2 repeated following manual work  Hip flexor stretch supine 30 sec x 2 R/L using strap to lift LE from stretch position  Manual Therapy: TPR/STM hip flexors; extensors; laterally through ITB/ lateral hip   Myofacial release work R/L LE into IR passively  Passive hip IR/ER patient in prone  Quad stretch prone PT assist 30 sec x 2  Neuromuscular re-ed: Myofacial ball release psoas and gluts/piriformis bilat (HEP) Therapeutic Activity: Standing weight bearing bilat LE's  Sitting anterior/posterior pelvic tilt partial range (some LBP)  Happy baby supine  Self Care: Ball release work    [facility] Adult PT Treatment:                                                DATE: 08/16/24 Therapeutic Exercise: Hamstring stretch 30 sec x 2 repeated following manual work  IT band stretch 30 sec x 3 repeated following manual work  Hip adductor stretch with strap 30 sec x 2 repeated following manual work  Piriformis stretch supine 30 sec x 2 repeated following manual work  Manual Therapy: TPR/STM hip flexors; extensors; laterally through ITB/ lateral hip  Myofacial release work R LE into [employer] passively  Passive hip IR/ER patient in prone  Quad stretch prone PT assist 30 sec x 2  Neuromuscular re-ed: Myofacial ball release psoas and gluts/piriformis bilat (HEP)  Therapeutic Activity: Standing weight bearing bilat LE's  Weight shift in sitting pressing into the thighs  Weight bearing bilat LE in standing  Weight shift laterally side to side slowly  Weight shift small step forward with each foot ~ 8-10 reps each  Self Care: Kinesotaping for patellar alignment  Ball release work    [facility] Adult PT Treatment:                                                DATE: 08/02/24 Therapeutic Exercise: Hamstring stretch 30 sec x 2 IT band stretch 30 sec x 3  Manual Therapy: Medial glide for patella Kinesotaping for patella realignment  Neuromuscular re-ed: Myofacial ball release psoas and gluts/piriformis bilat (HEP) Pelvis realignment supine and standing 2 sec x 10 (HEP) Quad set 3 sec x 10 x 2  SLR in slight ER 3 sec x 10  SLS R/L ~ 10 sec x 10 R/L  Therapeutic Activity: Bridge blue TB 5 sec hold x 10;  repeated with ball btn knees 5 sec x 10  Hip abduction in hooklying alternating LE's  blue TB 3 sec x 10 R/L  Clamshell green TB thigh 3 sec x 10 R/L  Reverse clam shell green TB 3 sec x 10 R/L Wall squat with ball btn knees 5 sec x 10  Sidelying hip adduction L LE  ITB stretch sidelying 30 sec x 1 R/L  Step up 2 inch step x 10 x 2 R/L - fingertips to assist with balance on L  Sit to stand from elevated table moving slowly x 10  Side steps green TB distal thigh 8 feet x 5 R/L  Tandem  walking and backward walking 8 feet x 5 each  Self Care: Kinesotaping for patellar alignment  Ball release work   PATIENT EDUCATION:  Education details: POC; HEP  Person educated: Patient Education method: Programmer, Multimedia, Facilities Manager, Actor cues, Verbal cues, and Handouts Education comprehension: verbalized understanding, returned demonstration, verbal cues required, tactile cues required, and needs further education  HOME EXERCISE PROGRAM: Access Code: [email or web address] URL: [email or web address] Date: 08/23/2024 Prepared by: Jaymen Fetch  Exercises - Supine Quad Set  - 1 x daily - 7 x weekly - 1 sets - 10 reps - 3 sec  hold - Straight Leg Raise with External Rotation  - 1 x daily - 7 x weekly - 1 sets - 10 reps - 3-5 sec  hold - Hooklying Hamstring Stretch with Strap  - 1 x daily - 7 x weekly - 1 sets - 3 reps - 30 sec  hold - Supine ITB Stretch with Strap  - 1 x daily - 7 x weekly - 1 sets - 3 reps - 30 sec  hold - Hip Flexor Stretch at Edge of Bed  - 1 x daily - 7 x weekly - 1 sets - 3 reps - 30 sec  hold - Supine Transversus Abdominis Bracing with Pelvic Floor Contraction  - 2 x daily - 7 x weekly - 1 sets - 10 reps - 10sec  hold - Wall Quarter Squat  - 2 x daily - 7 x weekly - 1-2 sets - 10 reps - 5-10 sec  hold - Bridge  - 2 x daily - 7 x weekly - 1-2 sets - 10 reps - 5 sec  hold - Supine Bridge with Mini Swiss Ball Between Knees  - 1 x daily - 7 x weekly - 1-2 sets - 10 reps - 5 sec   hold - Wall Squat Hold with Ball  - 1 x daily - 7 x weekly - 1-2 sets - 10 reps - 5-10 sec  hold - Sit to Stand  - 1 x daily - 7 x weekly - 1 sets - 10 reps - 3-5 sec  hold - Hooklying Isometric Clamshell  - 2 x daily - 7 x weekly - 1 sets - 10 reps - 3 sec  hold - Supine Bridge with Resistance Band  - 2 x daily - 7 x weekly - 1-2 sets - 5-10 reps - 5-10 sec  hold - Sidelying Hip Abduction  - 1 x daily - 7 x weekly - 3 sets - 10 reps - 3-5 sec  hold - Single Leg Stance with Support  - 1 x daily - 7 x weekly - 1 sets - 3 reps - 30 sec  hold - Clam with Resistance  - 1 x daily - 7 x weekly - 1 sets - 10 reps - 3-5 sec  hold - Step Up  - 2 x daily - 7 x weekly - 1 sets - 10 reps - 2 sec  hold - Sit to Stand  - 2 x daily - 7 x weekly - 1 sets - 10 reps - 3-5 sec  hold - Side Stepping with Resistance at Thighs  - 1 x daily - 7 x weekly - Tandem Walking  - 1 x daily - 7 x weekly - 1 sets - 3 reps - 30 sec  hold - Backwards Walking  - 1 x daily - 7 x weekly - 1 sets - 3 reps - 30 sec  hold - Plank on Counter  -  1 x daily - 7 x weekly - 1 sets - 3 reps - 30 sec  hold - Supine Pelvic Floor Stretch  - 2 x daily - 7 x weekly  ASSESSMENT:  CLINICAL IMPRESSION: Patient reports good resolution of the L knee and hip pain. She demonstrates good strength and ROM through bilat LE's. She has increased activity level. Zareya has persistent pain in the LB which has been present for many years. She she is working on exercises for LE strengthening and back stabilization to HEP. She is trying to walk ~ 4000 steps a day. Treatment consistented of re-eval; therapeutic exercises as noted; manual work through the hips; exercise review; recommendations for transitional movements getting in and out of bed and modifications for computer work which she currently does propped up a bolster in supine. She will continue with stretching and ball release work; gradually progressing strengthening exercises. She has a good program for  core stabilization to address LBP from prior treatment. Goals of therapy have been accomplished and Domenique will be discharged to independent HEP.   Eval: Patient is a 54 y.o. female who was seen today for physical therapy evaluation and treatment for L knee patellofemoral pain.   OBJECTIVE IMPAIRMENTS: Abnormal gait, decreased activity tolerance, decreased balance, decreased strength, improper body mechanics, and pain.    GOALS: Goals reviewed with patient? Yes  SHORT TERM GOALS: Target date: 06/21/2024   Independent in initial HEP  Baseline: Goal status: met  2.  Patient reports ability to walk without L knee popping Baseline:  Goal status: met   3.  Patient reports increased walking tolerance to 10 min without increased L knee pain  Baseline:  Goal status: on goingmet  LONG TERM GOALS: Target date: 10/04/24 final visit    Decrease L knee pain by 50-75% allowing patient to increase functional activities and exercises tolerance  Baseline:  Goal status: met   2.  Increase strength bilat LE's to 4+/5 to 5/5  Baseline:  Goal status: met  3.  Patient reports ability to tolerate exercise video involving jumping with minimal to no increase in L knee pain  Baseline:  Goal status: met   4.  Patient reports and demonstrates ability to squat and return to stand with minimal to no increase in L knee pain  Baseline:  Goal status: on going   5.  PSFS: THE PATIENT SPECIFIC FUNCTIONAL SCALE  Place score of 0-10 (0 = unable to perform activity and 10 = able to perform activity at the same level as before injury or problem)  Activity Date: 05/24/24  Date:  10/04/24  Walking > 5 min  6 10 10   2. Jumping  2 3 6   3. Squatting  0 2 4  4.  0    Total Score 8 10 20     Total Score = Sum of activity scores/number of activities 8/3= 2.67 08/02/24: 15/3 = 5  10/04/24: 20/3 = 6.67  Minimally Detectable Change: 3 points (for single activity); 2 points (for average score) Baseline:  Goal  status: met   6.  Independent in advanced HEP including aquatic program as indicated  Baseline:  Goal status: met    PLAN:  PT FREQUENCY: 2x/week  PT DURATION: 8 weeks  PLANNED INTERVENTIONS: 97164- PT Re-evaluation, 97110-Therapeutic exercises, 97530- Therapeutic activity, [id number]- Neuromuscular re-education, 97535- Self Care, [id number]- Manual therapy, [phone]- Gait training, [phone]- Aquatic Therapy, Patient/Family education, Balance training, Stair training, Taping, and Joint mobilization  PLAN FOR NEXT SESSION: d/c to HEP  Albertia Carvin P Braylin Xu, PT 10/04/2024, 12:14 PM  "

## 2024-10-06 ENCOUNTER — Ambulatory Visit

## 2024-10-06 VITALS — BP 118/84 | Ht 65.0 in | Wt 140.0 lb

## 2024-10-06 DIAGNOSIS — M545 Low back pain, unspecified: Secondary | ICD-10-CM

## 2024-10-06 DIAGNOSIS — Q796 Ehlers-Danlos syndrome, unspecified: Secondary | ICD-10-CM | POA: Diagnosis not present

## 2024-10-06 MED ORDER — MILNACIPRAN HCL 25 MG PO TABS: 75.0000 mg | ORAL_TABLET | Freq: Two times a day (BID) | ORAL | 1 refills | Status: DC

## 2024-10-06 NOTE — Progress Notes (Signed)
" ° °  Subjective:    Patient ID: Kathryn Mosley, female    DOB: 54 y.o., [birth year]   MRN: [id number]  Chief Complaint: Low back pain flare  History of Present Illness Kathryn Mosley is a 54 year old female with Ehlers-Danlos syndrome who presents for evaluation of a flare of chronic low back pain.  Low Back Pain: - Chronic pain since childhood, associated with Ehlers-Danlos syndrome and spinal degenerative changes on prior imaging - Current flare began more than 1.5 weeks ago, with periodic exacerbations - Pain is severe, described as raw, and localized to the lower back - Aggravated by lying prone, leg lifts, back extension, and single leg stance with back bending - Relieved by forward flexion, including fetal position - No radiation to the legs or shooting pain - Associated tension between the shoulder blades and neck discomfort, but low back is most symptomatic - Nonpainful thud sensation in the back with infrequent bowel movements - No recent change in activity to explain the flare - Mostly bedridden, often lies on a bolster in sustained spinal flexion - History of bulging and herniated discs  Pain Management and Medications: - Past pain management included Dolabed in childhood - Current medications: modafinil (recently switched from Adderall), Qulipta (started about 1.5 weeks ago), Savella  for muscle twitches, Lyrica  75 mg twice daily for pain - Perceived decreased effectiveness of Lyrica  when taken with Savella  - Has not tried heat therapy for this flare despite having heating pads  Cognitive Symptoms: - Intermittent brain fog, uncertain if related to medications or underlying conditions  Objective:   Vitals:   10/06/24 1527  BP: 118/84    Lumbar back: Tenderness to palpation along the lumbar paraspinal musculature.  No midline spinal tenderness.  Mild guarding with making position changes from chair to exam table.  Gait normal.  Positive stork.  Negative straight leg raise.   Negative femoral stretch.     Assessment & Plan:   Assessment & Plan Chronic low back pain due to Ehlers-Danlos syndrome She experiences chronic low back pain with a recent muscular flare, worsened by extension and movement. Degenerative changes and possible facet joint involvement are present, with disc pathology considered. There are no radicular symptoms, and the similarity to prior flares makes acute bony injury or insidious pathology unlikely. Ehlers-Danlos syndrome contributes to joint and soft tissue vulnerability. Current symptoms are reassuring and expected to resolve as with previous episodes. Recommend heat therapy for symptomatic relief and a short course of anti-inflammatory medication for acute symptom management. Reviewed her current medication regimen and ultimately feel that increasing Savella  from 50 mg twice daily to 75 mg twice daily may help provide additional resilience to future flares. Advise continuation of her home exercise program and core strengthening as instructed by physical therapy. Repeat lumbar spine radiography is deferred; she should notify if symptoms change or new neurological deficits develop, at which point imaging would be ordered.  32 minutes were spent in time face-to-face discussing with the patient the nature of their symptoms, evaluating her, explaining treatment options, then documenting the aforementioned on the same day of service. "

## 2024-10-22 ENCOUNTER — Other Ambulatory Visit: Payer: Self-pay

## 2024-10-22 DIAGNOSIS — Q796 Ehlers-Danlos syndrome, unspecified: Secondary | ICD-10-CM

## 2024-10-22 DIAGNOSIS — M545 Low back pain, unspecified: Secondary | ICD-10-CM

## 2024-10-22 MED ORDER — MILNACIPRAN HCL 100 MG PO TABS: ORAL_TABLET | ORAL | 0 refills | Status: AC

## 2024-12-06 ENCOUNTER — Ambulatory Visit
